# Patient Record
Sex: Male | Born: 1935 | Race: White | Hispanic: No | Smoking: Never smoker
Health system: Southern US, Community
[De-identification: ages and names within clinical notes are randomized; demographics above are authoritative.]

## PROBLEM LIST (undated history)

## (undated) DIAGNOSIS — I509 Heart failure, unspecified: Secondary | ICD-10-CM

## (undated) DIAGNOSIS — C4490 Unspecified malignant neoplasm of skin, unspecified: Secondary | ICD-10-CM

## (undated) DIAGNOSIS — H9190 Unspecified hearing loss, unspecified ear: Secondary | ICD-10-CM

## (undated) DIAGNOSIS — I10 Essential (primary) hypertension: Secondary | ICD-10-CM

## (undated) DIAGNOSIS — C801 Malignant (primary) neoplasm, unspecified: Secondary | ICD-10-CM

## (undated) DIAGNOSIS — I4891 Unspecified atrial fibrillation: Secondary | ICD-10-CM

## (undated) HISTORY — PX: SPINE SURGERY: SHX786

---

## 1998-12-13 ENCOUNTER — Ambulatory Visit (HOSPITAL_COMMUNITY): Admission: RE | Admit: 1998-12-13 | Discharge: 1998-12-13 | Payer: Self-pay | Admitting: Neurosurgery

## 1998-12-13 ENCOUNTER — Encounter: Payer: Self-pay | Admitting: Neurosurgery

## 1999-01-09 ENCOUNTER — Encounter: Payer: Self-pay | Admitting: Neurosurgery

## 1999-01-11 ENCOUNTER — Encounter: Payer: Self-pay | Admitting: Neurosurgery

## 1999-01-11 ENCOUNTER — Inpatient Hospital Stay (HOSPITAL_COMMUNITY): Admission: RE | Admit: 1999-01-11 | Discharge: 1999-01-17 | Payer: Self-pay | Admitting: Neurosurgery

## 2005-02-26 ENCOUNTER — Ambulatory Visit (HOSPITAL_COMMUNITY): Admission: RE | Admit: 2005-02-26 | Discharge: 2005-02-26 | Payer: Self-pay | Admitting: Urology

## 2005-02-26 ENCOUNTER — Ambulatory Visit (HOSPITAL_BASED_OUTPATIENT_CLINIC_OR_DEPARTMENT_OTHER): Admission: RE | Admit: 2005-02-26 | Discharge: 2005-02-26 | Payer: Self-pay | Admitting: Urology

## 2005-02-26 ENCOUNTER — Encounter (INDEPENDENT_AMBULATORY_CARE_PROVIDER_SITE_OTHER): Payer: Self-pay | Admitting: Specialist

## 2006-08-08 ENCOUNTER — Ambulatory Visit: Payer: Self-pay | Admitting: Internal Medicine

## 2006-08-08 ENCOUNTER — Encounter (INDEPENDENT_AMBULATORY_CARE_PROVIDER_SITE_OTHER): Payer: Self-pay | Admitting: Orthopaedic Surgery

## 2006-08-08 ENCOUNTER — Ambulatory Visit: Admission: RE | Admit: 2006-08-08 | Discharge: 2006-08-08 | Payer: Self-pay | Admitting: Orthopaedic Surgery

## 2006-08-11 ENCOUNTER — Inpatient Hospital Stay (HOSPITAL_COMMUNITY): Admission: RE | Admit: 2006-08-11 | Discharge: 2006-08-12 | Payer: Self-pay | Admitting: Orthopaedic Surgery

## 2006-09-27 ENCOUNTER — Inpatient Hospital Stay (HOSPITAL_COMMUNITY): Admission: EM | Admit: 2006-09-27 | Discharge: 2006-10-04 | Payer: Self-pay | Admitting: Emergency Medicine

## 2006-10-01 ENCOUNTER — Encounter (INDEPENDENT_AMBULATORY_CARE_PROVIDER_SITE_OTHER): Payer: Self-pay | Admitting: Otolaryngology

## 2006-11-05 ENCOUNTER — Encounter: Admission: RE | Admit: 2006-11-05 | Discharge: 2006-11-05 | Payer: Self-pay | Admitting: Orthopaedic Surgery

## 2006-11-12 ENCOUNTER — Encounter: Admission: RE | Admit: 2006-11-12 | Discharge: 2006-11-12 | Payer: Self-pay | Admitting: Orthopaedic Surgery

## 2009-05-16 ENCOUNTER — Inpatient Hospital Stay (HOSPITAL_COMMUNITY): Admission: EM | Admit: 2009-05-16 | Discharge: 2009-05-19 | Payer: Self-pay | Admitting: Emergency Medicine

## 2010-06-03 LAB — BASIC METABOLIC PANEL
BUN: 20 mg/dL (ref 6–23)
CO2: 27 mEq/L (ref 19–32)
Chloride: 103 mEq/L (ref 96–112)
Glucose, Bld: 86 mg/dL (ref 70–99)
Potassium: 4.1 mEq/L (ref 3.5–5.1)

## 2010-06-03 LAB — CBC
HCT: 44.5 % (ref 39.0–52.0)
Hemoglobin: 13.8 g/dL (ref 13.0–17.0)
MCHC: 32.4 g/dL (ref 30.0–36.0)
MCHC: 33 g/dL (ref 30.0–36.0)
MCV: 100 fL (ref 78.0–100.0)
MCV: 100.7 fL — ABNORMAL HIGH (ref 78.0–100.0)
Platelets: 164 10*3/uL (ref 150–400)
RBC: 4.18 MIL/uL — ABNORMAL LOW (ref 4.22–5.81)
RDW: 14.5 % (ref 11.5–15.5)
WBC: 4.7 10*3/uL (ref 4.0–10.5)

## 2010-06-03 LAB — PROTIME-INR: Prothrombin Time: 29.4 seconds — ABNORMAL HIGH (ref 11.6–15.2)

## 2010-06-03 LAB — DIFFERENTIAL
Basophils Absolute: 0 10*3/uL (ref 0.0–0.1)
Basophils Relative: 1 % (ref 0–1)
Eosinophils Absolute: 0.1 10*3/uL (ref 0.0–0.7)
Eosinophils Absolute: 0.1 10*3/uL (ref 0.0–0.7)
Eosinophils Relative: 2 % (ref 0–5)
Lymphs Abs: 0.9 10*3/uL (ref 0.7–4.0)
Monocytes Absolute: 0.5 10*3/uL (ref 0.1–1.0)
Monocytes Relative: 10 % (ref 3–12)
Neutro Abs: 3.2 10*3/uL (ref 1.7–7.7)

## 2010-07-24 NOTE — Op Note (Signed)
NAME:  LEWIN, PELLOW NO.:  [id number]   MEDICAL RECORD NO.:  [id number]          PATIENT TYPE:  INP   LOCATION:  4708                         FACILITY:  [facility]   PHYSICIAN:  Lucky Cowboy, MD         DATE OF BIRTH:  December 05, 1913   DATE OF PROCEDURE:  10/01/2006  DATE OF DISCHARGE:                               OPERATIVE REPORT   PREOPERATIVE DIAGNOSIS:  Right-sided tonsil mass.   POSTOPERATIVE DIAGNOSIS:  Right-sided tonsil mass with right  intratonsillar abscess.   PROCEDURE:  Right tonsillectomy.   SURGEON:  Lucky Cowboy, MD   ANESTHESIA:  General endotracheal anesthesia.   ESTIMATED BLOOD LOSS:  Less than 10 mL.   SPECIMENS:  Right tonsil was culture of right tonsil pus.   COMPLICATIONS:  None.   INDICATIONS:  This patient is a 77 year old male with an acute onset of  severe right throat pain and swelling, which occurred 5 days ago.  He  has been the hospital on IV Unasyn with some improvement.  However, re-  CT scan did reveal a 1 cm right tonsil mass.  The concern would be that  he has underlying neoplasia with secondary infection and for this  reason, excisional biopsy is performed.   FINDINGS:  The patient was noted to have a significant amount of  firmness within the right tonsil, particularly superiorly.  There is  also intratonsillar pus.   PROCEDURE:  The patient was taken to the operating room and placed on  the table in the supine position.  He was then placed under general  endotracheal anesthesia and the table rotated counterclockwise 90  degrees.  The neck was gently extended.  The head and body were draped  in the usual sterile fashion.  A Crowe-Davis mouth gag with a #4 tongue  blade was then placed intraorally, opened and suspended on the Mayo  stand.  The right palatine tonsil was then palpated.  It was determined  that this abnormal portion should be excised.  This almost involved the  entire tonsil and for this reason, tonsillectomy  was performed.  The  right palatine tonsil was grasped with Allis clamps and directed  inferior medially.  The Bovie was then used to excise the tonsil.  Pus  was expressed in this process and sent for culture and sensitivity, both  aerobic and anaerobic.  Suction cautery was required for hemostasis.  The oral cavity was then suctioned free of blood.  An NG tube was placed  down the esophagus for suctioning of the gastric  contents.  The mouth gag was removed, noting no damage to the teeth or  soft tissues.  The table was rotated clockwise 90 degrees to its  original position.  The patient was awakened from anesthesia and taken  to the Post Anesthesia Care Unit in stable condition.  There were no  complications.      Lucky Cowboy, MD  Electronically Signed     SJ/MEDQ  D:  10/01/2006  T:  10/02/2006  Job:  [id number]   cc:   [facility]

## 2010-07-24 NOTE — Op Note (Signed)
NAME:  Dustin Bean, Dustin Bean NO.:  [id number]   MEDICAL RECORD NO.:  [id number]          PATIENT TYPE:  INP   LOCATION:  5009                         FACILITY:  [facility]   PHYSICIAN:  Mark C. Ophelia Charter, M.D.    DATE OF BIRTH:  [birth year]   DATE OF PROCEDURE:  08/11/2006  DATE OF DISCHARGE:  08/12/2006                               OPERATIVE REPORT   REDICTATION:   PREOPERATIVE DIAGNOSIS:  L2-3 spinal stenosis.   POSTOPERATIVE DIAGNOSIS:  L2-3 spinal stenosis.   PROCEDURE:  L2-3 central decompression.   SURGEON:  Annell Greening, MD   ASSISTANT:  Maud Deed, PA-C   ANESTHESIA:  GOT plus Marcaine locally.   ESTIMATED BLOOD LOSS:  150 mL.   DRAINS:  None.   INDICATION:  This 77 year old male has severe spinal stenosis with  complete cut-off on myelo-CT with neurogenic claudication.  For his age,  he is in exceptionally good condition and other than neurogenic  claudication, he is not having any significant problems.  He is admitted  for a decompression.   PROCEDURE:  After induction of general anesthesia, the patient was  placed prone with careful padding, positioning and a time-out procedure.  Preoperative antibiotics were given prophylactically.  DuraPrep, the  area squared with towels, Betadine and Vi-Drape.  Laminectomy sheets and  drapes.  A spinal needle localization was performed with cross-table  lateral x-ray and incision was made.  Self-retaining retractors were  placed.  Subperiosteal dissection at the 2-3 level was performed and  decompression was performed, removing the L2 lamina and the top portion  of the L3 lamina, exposing the L2-3 disk.  There was hypertrophic  ligamentum with thick chunks and dura was decompressed without dural  tear.  Bone was removed out to the level of the pedicles bilaterally and  epidural fat was seen above and below the areas of severe compression.  After irrigation with saline solution, the operative microscope, which  was  used during the procedure, was removed; it has been draped at the  beginning of the procedure.  Nerve roots were free in both foramina by  palpation with a hockey-stick, no fragments anterior to the  dura by palpation and visualization.  The deep fascia was closed with 0  Vicryl, 2-0 Vicryl in the subcutaneous tissue and then skin closure.  Instrument count and needle count was correct.  The patient tolerated  the procedure well.  Office followup 1 week postop.      Mark C. Ophelia Charter, M.D.  Electronically Signed     MCY/MEDQ  D:  10/17/2006  T:  10/18/2006  Job:  [id number]

## 2010-07-24 NOTE — Discharge Summary (Signed)
NAME:  Dustin Bean, COONRADT NO.:  [id number]   MEDICAL RECORD NO.:  [id number]          PATIENT TYPE:  INP   LOCATION:  4708                         FACILITY:  [facility]   PHYSICIAN:  Lucky Cowboy, MD         DATE OF BIRTH:  1914-02-24   DATE OF ADMISSION:  09/27/2006  DATE OF DISCHARGE:  10/04/2006                               DISCHARGE SUMMARY   DISCHARGE DIAGNOSES:  1. Right-sided peritonsillar abscess.  2. Atrial fibrillation.  3. History of prostate cancer.  4. Spinal stenosis.   PAST SURGICAL HISTORY:  Four spinal stenosis surgeries were performed by  Dr. Ophelia Charter.   PROCEDURES PERFORMED:  Right-sided tonsillectomy.   CONSULTATIONS:  Internal medicine for anticoagulation assistance.   HOSPITAL COURSE:  This patient is a 77 year old male with a 2-day  history of right-sided sore throat but no fever.  He was seen at [facility] and referred.  Examination in the emergency room  revealed the right tonsil to be with yellow and protuberant region,  approximately 6 mm area.  There was tonsillar enlargement with erythema.  However, the soft palate was normal.  He was admitted for evaluation  with the concern being possible tonsillar abscess versus tonsillar  cancer.  Strep was negative.  He was also admitted for pain control.  A  CT scan with contrast was performed.  This revealed an enlarged  enhancing mass in the right tonsil.  The following morning, after being  placed on Unasyn, he was remarkably improved with minimal pain and a  marked decrease in the right tonsil area.  The concern still remained  with the CT scan findings despite his improvement.  [facility] team was  consulted for converting the patient to heparin for a planned biopsy  versus tonsillectomy.  On September 30, 2006, a CT scan was repeated which  revealed a 1-cm mass in the right tonsil.  He was taken to the operating  room.  A right tonsillectomy was performed.  This revealed a tonsillar  abscess with purulent fluid in the right upper pole.  He tolerated this  procedure very well and was restarted on anticoagulation before  discharge with Coumadin.  He did not have any problems whatsoever and  was discharged on October 04, 2006, with plans to follow up in his Coumadin  clinic.   DISCHARGE CONDITION:  Stable.      Lucky Cowboy, MD  Electronically Signed     SJ/MEDQ  D:  11/06/2006  T:  11/07/2006  Job:  [id number]

## 2010-07-27 NOTE — Op Note (Signed)
NAME:  Dustin Bean, Dustin Bean NO.:  [id number]   MEDICAL RECORD NO.:  [id number]          PATIENT TYPE:  AMB   LOCATION:  NESC                         FACILITY:  [facility]   PHYSICIAN:  Ronald L. Earlene Plater, M.D.  DATE OF BIRTH:  Oct 05, 1913   DATE OF PROCEDURE:  02/26/2005  DATE OF DISCHARGE:                                 OPERATIVE REPORT   DIAGNOSIS:  Balanitis with ulceration of the foreskin.   OPERATIVE PROCEDURE:  Circumcision.   SURGEON:  Lucrezia Starch. Earlene Plater, M.D.   ANESTHESIA:  LMA.   ESTIMATED BLOOD LOSS:  Negligible.   TUBES:  None.   COMPLICATIONS:  None.   INDICATIONS FOR PROCEDURE:  [name] is a very nice 77 year old white male  who presented with an ulcerating foreskin noted to have progressively  worsened.  It certainly needs to be biopsied, but with the worsening  situation, irritation, and pain, it was felt that circumcision was  indicated.  He has been properly cleared.  He stopped his Coumadin ahead and  has elected to proceed.   PROCEDURE IN DETAIL:  Patient was placed in a supine position.  After proper  LMA anesthesia, he was placed in the dorsal lithotomy position, prepped and  draped with Betadine in a sterile fashion.  A circumferential incision was  made in the shaft skin at the appropriate level and extended to Buck's  fascia, corpus spongiosum, and a similar incision was made approximately 2  mm proximal to the corona __________ and extended to the same level.  A  dorsal slit was then performed, and the foreskin was removed with Bovie  coagulation, cautery, and sharp dissection.  It was submitted to pathology  for definitive diagnosis.  Thorough irrigation was performed.  Good  hemostasis was noted to be present.  The shaft skin was approximated to the  mucosa.  A dorsal stitch was placed, and a ventral U-type stitch was placed,  and each were run laterally.  A frenuloplasty was then performed with a  horizontal mattress type stitch in the  frenulum.  Good hemostasis was noted  to be present.  The wound was dressed with Vaseline gauze, 4x4, and a Coban.  He tolerated the procedure well.  No complications.  He was taken to the  recovery room stable.      Ronald L. Earlene Plater, M.D.  Electronically Signed     RLD/MEDQ  D:  02/26/2005  T:  02/27/2005  Job:  [id number]

## 2010-12-24 LAB — CBC
HCT: 35.7 — ABNORMAL LOW
HCT: 36.2 — ABNORMAL LOW
HCT: 36.3 — ABNORMAL LOW
HCT: 36.8 — ABNORMAL LOW
Hemoglobin: 12.2 — ABNORMAL LOW
Hemoglobin: 12.3 — ABNORMAL LOW
Hemoglobin: 12.4 — ABNORMAL LOW
Hemoglobin: 12.7 — ABNORMAL LOW
MCHC: 34.2
MCHC: 34.3
MCHC: 34.5
MCV: 95.1
MCV: 95.5
MCV: 96.3
MCV: 97.2
Platelets: 174
Platelets: 180
Platelets: 183
RBC: 3.71 — ABNORMAL LOW
RBC: 3.8 — ABNORMAL LOW
RBC: 3.87 — ABNORMAL LOW
RDW: 13.9
RDW: 14
RDW: 14.1 — ABNORMAL HIGH
RDW: 14.3 — ABNORMAL HIGH
WBC: 4.6
WBC: 6.6

## 2010-12-24 LAB — DIFFERENTIAL
Basophils Absolute: 0
Basophils Relative: 0
Eosinophils Absolute: 0.1
Eosinophils Relative: 1
Monocytes Absolute: 0.6

## 2010-12-24 LAB — RAPID STREP SCREEN (MED CTR MEBANE ONLY): Streptococcus, Group A Screen (Direct): NEGATIVE

## 2010-12-24 LAB — BASIC METABOLIC PANEL
BUN: 8
CO2: 30
Calcium: 8.8
Chloride: 104
Creatinine, Ser: 1.12
GFR calc Af Amer: >60
GFR calc non Af Amer: >60
Glucose, Bld: 97
Potassium: 3.9
Sodium: 139

## 2010-12-24 LAB — COMPREHENSIVE METABOLIC PANEL WITH GFR
ALT: 14
AST: 23
Albumin: 3.5
Alkaline Phosphatase: 62
BUN: 10
CO2: 29
Calcium: 8.8
Chloride: 106
Creatinine, Ser: 0.93
GFR calc non Af Amer: >60
Glucose, Bld: 95
Potassium: 4.2
Sodium: 138
Total Bilirubin: 0.7
Total Protein: 6.5

## 2010-12-24 LAB — PROTIME-INR
INR: 1.2
INR: 1.5
INR: 2.4 — ABNORMAL HIGH
INR: 3 — ABNORMAL HIGH
Prothrombin Time: 15.2
Prothrombin Time: 18.6 — ABNORMAL HIGH
Prothrombin Time: 27.3 — ABNORMAL HIGH
Prothrombin Time: 30 — ABNORMAL HIGH
Prothrombin Time: 32.6 — ABNORMAL HIGH

## 2010-12-24 LAB — ABO/RH: ABO/RH(D): O NEG

## 2010-12-24 LAB — CULTURE, ROUTINE-ABSCESS: Culture: NORMAL

## 2010-12-24 LAB — HEPARIN LEVEL (UNFRACTIONATED)
Heparin Unfractionated: 0.21 — ABNORMAL LOW
Heparin Unfractionated: <0.1 — ABNORMAL LOW

## 2010-12-24 LAB — ANAEROBIC CULTURE

## 2010-12-24 LAB — PREPARE FRESH FROZEN PLASMA

## 2010-12-27 LAB — APTT: aPTT: 33

## 2010-12-27 LAB — PROTIME-INR: Prothrombin Time: 15.4 — ABNORMAL HIGH

## 2014-01-28 ENCOUNTER — Encounter (HOSPITAL_COMMUNITY): Payer: Self-pay

## 2014-01-28 ENCOUNTER — Emergency Department (HOSPITAL_COMMUNITY)
Admission: EM | Admit: 2014-01-28 | Discharge: 2014-01-28 | Disposition: A | Discharge status: Home / Self Care | Payer: Medicare Other | Attending: Emergency Medicine | Admitting: Emergency Medicine

## 2014-01-28 DIAGNOSIS — Z7901 Long term (current) use of anticoagulants: Secondary | ICD-10-CM | POA: Insufficient documentation

## 2014-01-28 DIAGNOSIS — Z7982 Long term (current) use of aspirin: Secondary | ICD-10-CM | POA: Diagnosis not present

## 2014-01-28 DIAGNOSIS — Z79899 Other long term (current) drug therapy: Secondary | ICD-10-CM | POA: Diagnosis not present

## 2014-01-28 DIAGNOSIS — I4891 Unspecified atrial fibrillation: Secondary | ICD-10-CM | POA: Insufficient documentation

## 2014-01-28 DIAGNOSIS — Z8546 Personal history of malignant neoplasm of prostate: Secondary | ICD-10-CM | POA: Diagnosis not present

## 2014-01-28 DIAGNOSIS — K625 Hemorrhage of anus and rectum: Secondary | ICD-10-CM | POA: Diagnosis not present

## 2014-01-28 DIAGNOSIS — N400 Enlarged prostate without lower urinary tract symptoms: Secondary | ICD-10-CM | POA: Insufficient documentation

## 2014-01-28 DIAGNOSIS — Z7951 Long term (current) use of inhaled steroids: Secondary | ICD-10-CM | POA: Diagnosis not present

## 2014-01-28 HISTORY — DX: Malignant (primary) neoplasm, unspecified: C80.1

## 2014-01-28 HISTORY — DX: Unspecified atrial fibrillation: I48.91

## 2014-01-28 LAB — COMPREHENSIVE METABOLIC PANEL
ALK PHOS: 54 U/L (ref 39–117)
ALT: 16 U/L (ref 0–53)
ANION GAP: 10 (ref 5–15)
AST: 20 U/L (ref 0–37)
Albumin: 3.5 g/dL (ref 3.5–5.2)
BILIRUBIN TOTAL: 0.9 mg/dL (ref 0.3–1.2)
BUN: 18 mg/dL (ref 6–23)
CHLORIDE: 104 meq/L (ref 96–112)
CO2: 27 meq/L (ref 19–32)
Calcium: 9.3 mg/dL (ref 8.4–10.5)
Creatinine, Ser: 1.12 mg/dL (ref 0.50–1.35)
GFR, EST AFRICAN AMERICAN: 61 mL/min — AB (ref >90)
GFR, EST NON AFRICAN AMERICAN: 53 mL/min — AB (ref >90)
GLUCOSE: 95 mg/dL (ref 70–99)
POTASSIUM: 4.7 meq/L (ref 3.7–5.3)
Sodium: 141 mEq/L (ref 137–147)
TOTAL PROTEIN: 6.6 g/dL (ref 6.0–8.3)

## 2014-01-28 LAB — POC OCCULT BLOOD, ED: Fecal Occult Bld: NEGATIVE

## 2014-01-28 LAB — CBC WITH DIFFERENTIAL/PLATELET
Basophils Absolute: 0 10*3/uL (ref 0.0–0.1)
Basophils Relative: 0 % (ref 0–1)
EOS ABS: 0 10*3/uL (ref 0.0–0.7)
Eosinophils Relative: 1 % (ref 0–5)
HCT: 42.5 % (ref 39.0–52.0)
HEMOGLOBIN: 14.3 g/dL (ref 13.0–17.0)
LYMPHS ABS: 1 10*3/uL (ref 0.7–4.0)
LYMPHS PCT: 16 % (ref 12–46)
MCH: 34.5 pg — AB (ref 26.0–34.0)
MCHC: 33.6 g/dL (ref 30.0–36.0)
MCV: 102.4 fL — ABNORMAL HIGH (ref 78.0–100.0)
MONOS PCT: 6 % (ref 3–12)
Monocytes Absolute: 0.4 10*3/uL (ref 0.1–1.0)
NEUTROS PCT: 77 % (ref 43–77)
Neutro Abs: 4.7 10*3/uL (ref 1.7–7.7)
Platelets: 122 10*3/uL — ABNORMAL LOW (ref 150–400)
RBC: 4.15 MIL/uL — AB (ref 4.22–5.81)
RDW: 15 % (ref 11.5–15.5)
WBC: 6.1 10*3/uL (ref 4.0–10.5)

## 2014-01-28 LAB — PROTIME-INR
INR: 1.93 — ABNORMAL HIGH (ref 0.00–1.49)
Prothrombin Time: 22.2 seconds — ABNORMAL HIGH (ref 11.6–15.2)

## 2014-01-28 MED ORDER — LOPERAMIDE HCL 2 MG PO CAPS: 2.0000 mg | ORAL_CAPSULE | Freq: Four times a day (QID) | ORAL | Status: DC | PRN

## 2014-01-28 NOTE — ED Notes (Signed)
Pt states 3 large BMs early this am, very dark in color, loose. No abd pain, N/V. States did increase his coumadin as directed by doctor over last couple of days. PA in room with pt, assisted in obtaining fecal occult. Pt states no needs at this time, vitals WNL, call bell in reach

## 2014-01-28 NOTE — ED Provider Notes (Signed)
CSN: [id number]     Arrival date & time 01/28/14  1301 History   First MD Initiated Contact with Patient 01/28/14 1316     Chief Complaint  Patient presents with  . Rectal Bleeding     (Consider location/radiation/quality/duration/timing/severity/associated sxs/prior Treatment) HPI Comments: Patient with history of atrial fibrillation on Coumadin, a baby aspirin a day, diclofenac gel -- presents with complaint of dark black stool. Patient states that he woke at approximately 5:30 AM today and had 3 bowel movements containing small round black stools. No bright red blood. Patient was not in any pain. Patient recently had his Coumadin dose adjusted upwards because his INR was low. Family is concerned that he is having a GI bleed. She does not have any abdominal pain. No lightheadedness or syncope. Patient thinks that his last colonoscopy was 3 years ago. He does not know that he has ever had diverticulosis, diverticulitis, AVMs. He does not report a history of hemorrhoids. No history of ulcers or upper abdominal pain. He currently has no other complaints. Patient was actually seen at an outside urgent care prior to ED arrival.  The history is provided by the patient, medical records and a relative.    Past Medical History  Diagnosis Date  . Atrial fibrillation   . Cancer     prostate   Past Surgical History  Procedure Laterality Date  . Spine surgery      x 4   History reviewed. No pertinent family history. History  Substance Use Topics  . Smoking status: Never Smoker   . Smokeless tobacco: Not on file  . Alcohol Use: Yes     Comment: rarely    Review of Systems  Constitutional: Negative for fever.  HENT: Negative for rhinorrhea and sore throat.   Eyes: Negative for redness.  Respiratory: Negative for cough.   Cardiovascular: Negative for chest pain.  Gastrointestinal: Positive for blood in stool (black stools seen). Negative for nausea, vomiting, abdominal pain and diarrhea.   Genitourinary: Negative for dysuria.  Musculoskeletal: Negative for myalgias.  Skin: Negative for rash.  Neurological: Negative for light-headedness and headaches.    Allergies  Review of patient's allergies indicates no known allergies.  Home Medications   Prior to Admission medications   Medication Sig Start Date End Date Taking? Authorizing Provider  Ascorbic Acid (VITAMIN C) 1000 MG tablet Take 1,000 mg by mouth daily.   Yes Historical Provider, MD  aspirin 81 MG EC tablet Take 81 mg by mouth daily. Swallow whole.   Yes Historical Provider, MD  atenolol (TENORMIN) 50 MG tablet Take 50 mg by mouth daily.   Yes Historical Provider, MD  Calcium-Magnesium-Vitamin D 400-166.7-133.3 MG-MG-UNIT TABS Take 1 tablet by mouth daily.   Yes Historical Provider, MD  cholecalciferol (VITAMIN D) 1000 UNITS tablet Take 2,000 Units by mouth daily.   Yes Historical Provider, MD  diclofenac sodium (VOLTAREN) 1 % GEL Apply 2 g topically 4 (four) times daily as needed (for pain).   Yes Historical Provider, MD  donepezil (ARICEPT) 10 MG tablet Take 5 mg by mouth at bedtime.   Yes Historical Provider, MD  fexofenadine (ALLEGRA) 180 MG tablet Take 180 mg by mouth daily.   Yes Historical Provider, MD  fluticasone (FLONASE) 50 MCG/ACT nasal spray Place 2 sprays into both nostrils daily.   Yes Historical Provider, MD  furosemide (LASIX) 20 MG tablet Take 20 mg by mouth daily.   Yes Historical Provider, MD  gabapentin (NEURONTIN) 100 MG capsule Take 100 mg  by mouth at bedtime as needed (for pain).   Yes Historical Provider, MD  Glucosamine-Chondroitin-MSM [phone] MG TABS Take 1 tablet by mouth daily.   Yes Historical Provider, MD  Influenza vac split quadrivalent PF (FLUARIX) 0.5 ML injection Inject 0.5 mLs into the muscle once.   Yes Historical Provider, MD  lovastatin (MEVACOR) 40 MG tablet Take 40 mg by mouth at bedtime.   Yes Historical Provider, MD  methocarbamol (ROBAXIN) 500 MG tablet Take 500 mg by  mouth 2 (two) times daily.   Yes Historical Provider, MD  mupirocin cream (BACTROBAN) 2 % Apply 1 application topically 2 (two) times daily.   Yes Historical Provider, MD  Omega-3 Fatty Acids (FISH OIL) 1000 MG CAPS Take 1 capsule by mouth daily.   Yes Historical Provider, MD  omeprazole (PRILOSEC) 20 MG capsule Take 20 mg by mouth daily.   Yes Historical Provider, MD  potassium chloride SA (K-DUR,KLOR-CON) 20 MEQ tablet Take 20 mEq by mouth daily.   Yes Historical Provider, MD  tamsulosin (FLOMAX) 0.4 MG CAPS capsule Take 0.4 mg by mouth daily.   Yes Historical Provider, MD  warfarin (COUMADIN) 1 MG tablet Take 6-6.5 mg by mouth daily. Takes with 5mg  to get a total of 6mg  or 6.5mg   Takes 6mg  every other day, then takes 6.5mg  every other day   Yes Historical Provider, MD  warfarin (COUMADIN) 5 MG tablet Take 6-6.5 mg by mouth daily. Takes with 5mg  to get a total of 6mg  or 6.5mg   Takes 6mg  every other day, then takes 6.5mg  every other day   Yes Historical Provider, MD   BP 138/89 mmHg  Pulse 96  Temp(Src) 97.6 F (36.4 C) (Oral)  Resp 20  SpO2 96%   Physical Exam  Constitutional: He appears well-developed and well-nourished.  HENT:  Head: Normocephalic and atraumatic.  Eyes: Conjunctivae are normal. Right eye exhibits no discharge. Left eye exhibits no discharge.  Neck: Normal range of motion. Neck supple.  Cardiovascular: Normal rate, regular rhythm and normal heart sounds.   Pulmonary/Chest: Effort normal and breath sounds normal. No respiratory distress. He has no wheezes. He has no rales.  Abdominal: Soft. Bowel sounds are normal. He exhibits no distension. There is no tenderness. There is no rebound and no guarding.  Genitourinary: Rectal exam shows no external hemorrhoid, no internal hemorrhoid, no fissure, no mass, no tenderness and anal tone normal. Guaiac negative stool. Prostate is enlarged. Prostate is not tender.  Neurological: He is alert.  Skin: Skin is warm and dry.   Psychiatric: He has a normal mood and affect.  Nursing note and vitals reviewed.   ED Course  Procedures (including critical care time) Labs Review Labs Reviewed  CBC WITH DIFFERENTIAL - Abnormal; Notable for the following:    RBC 4.15 (*)    MCV 102.4 (*)    MCH 34.5 (*)    Platelets 122 (*)    All other components within normal limits  COMPREHENSIVE METABOLIC PANEL - Abnormal; Notable for the following:    GFR calc non Af Amer 53 (*)    GFR calc Af Amer 61 (*)    All other components within normal limits  PROTIME-INR - Abnormal; Notable for the following:    Prothrombin Time 22.2 (*)    INR 1.93 (*)    All other components within normal limits  OCCULT BLOOD X 1 CARD TO LAB, STOOL  POC OCCULT BLOOD, ED    Imaging Review No results found.   EKG Interpretation None  1:47 PM Patient seen and examined. Work-up initiated. Discussed with Dr. Kathrynn Humble. Rectal exam performed with nurse chaperone.   Vital signs reviewed and are as follows: BP 138/89 mmHg  Pulse 96  Temp(Src) 97.6 F (36.4 C) (Oral)  Resp 20  SpO2 96%  Patient seen by Dr. Kathrynn Humble. Heme neg stool here -- no active bleeding. INR slightly low. Hgb normal. No abd pain. Vital signs are normal.   We both feel patient is safe for d/c to home at this point with close PCP and coumadin clinic follow-up.   Return instructions given to return if bleeding recurrs, he develops abdominal pain, or has other concerns.   BP 147/81 mmHg  Pulse 55  Temp(Src) 97.1 F (36.2 C) (Axillary)  Resp 19  SpO2 100%   MDM   Final diagnoses:  Rectal bleeding   Questionable rectal bleeding as above. Heme neg stools now without abd pain. INR subtherapeutic. Hgb normal. VSS. No indications for admission at this time. Patient is 80yo but looks extraordinarily well and has responsible family members to monitor him closely. He has appropriate follow-up. D/c to home with strict return instructions.   No dangerous or  life-threatening conditions suspected or identified by history, physical exam, and by work-up. No indications for hospitalization identified.     Carlisle Cater, PA-C 01/28/14 [place], MD 01/28/14 [phone]

## 2014-01-28 NOTE — ED Notes (Signed)
Per pt, having rectal bleeding.  Started today. Had coumadin increased 2 days ago d/t INR.

## 2014-01-28 NOTE — Discharge Instructions (Signed)
Please read and follow all provided instructions.  Your diagnoses today include:  1. Rectal bleeding    Tests performed today include:  Vital signs. See below for your results today.   INR - 1.9  Blood counts - normal  Stool test for blood - negative here  Medications prescribed:   None  Take any prescribed medications only as directed.  Home care instructions:  Follow any educational materials contained in this packet.  Follow-up instructions: Please follow-up with your primary care provider in the next 3 days for further evaluation of your symptoms.   Return instructions:   Please return to the Emergency Department if you experience worsening symptoms.   Please return if you have any other emergent concerns.  Additional Information:  Your vital signs today were: BP 138/89 mmHg   Pulse 96   Temp(Src) 97.6 F (36.4 C) (Oral)   Resp 20   SpO2 96% If your blood pressure (BP) was elevated above 135/85 this visit, please have this repeated by your doctor within one month. --------------

## 2014-02-01 ENCOUNTER — Ambulatory Visit
Admission: RE | Admit: 2014-02-01 | Discharge: 2014-02-01 | Disposition: A | Discharge status: Home / Self Care | Payer: Medicare Other | Source: Ambulatory Visit | Attending: Cardiology | Admitting: Cardiology

## 2014-02-01 ENCOUNTER — Ambulatory Visit (INDEPENDENT_AMBULATORY_CARE_PROVIDER_SITE_OTHER): Payer: Medicare Other | Admitting: Cardiology

## 2014-02-01 ENCOUNTER — Encounter: Payer: Self-pay | Admitting: Cardiology

## 2014-02-01 VITALS — BP 110/70 | HR 85 | Ht 73.0 in | Wt 186.0 lb

## 2014-02-01 DIAGNOSIS — R609 Edema, unspecified: Secondary | ICD-10-CM

## 2014-02-01 DIAGNOSIS — I4510 Unspecified right bundle-branch block: Secondary | ICD-10-CM

## 2014-02-01 DIAGNOSIS — I482 Chronic atrial fibrillation, unspecified: Secondary | ICD-10-CM

## 2014-02-01 DIAGNOSIS — I4891 Unspecified atrial fibrillation: Secondary | ICD-10-CM | POA: Insufficient documentation

## 2014-02-01 NOTE — Patient Instructions (Addendum)
A chest x-ray takes a picture of the organs and structures inside the chest, including the heart, lungs, and blood vessels. This test can show several things, including, whether the heart is enlarges; whether fluid is building up in the lungs; and whether pacemaker / defibrillator leads are still in place. [place]  Your physician has requested that you have an echocardiogram. Echocardiography is a painless test that uses sound waves to create images of your heart. It provides your doctor with information about the size and shape of your heart and how well your heart's chambers and valves are working. This procedure takes approximately one hour. There are no restrictions for this procedure.  Your physician recommends that you schedule a follow-up appointment in: [place] (TO GET ESTABLISHED)  Your physician recommends that you continue on your current medications as directed. Please refer to the Current Medication list given to you today.

## 2014-02-01 NOTE — Progress Notes (Signed)
Dustin Bean Date of Birth:  [birth year] [address] [place], [place]  [address] [phone]        Fax   [phone]   History of Present Illness:  this very pleasant alert 80 year old gentleman is seen by me for the first time today. He comes to establish cardiology care here in [place]. He is the father of Dustin Bean. The patient has been a resident at [facility] for the past 3 months. Prior to that he was living in [place]. His PCP Dr. [name] is in [place]. He intends to continue to use Dr. [name] for his PCP. He previously had been going to [facility] to see Dr. [name] in cardiology. Dr. [name] has been treating him for his atrial fibrillation for about 10 years. He is on rate control and anticoagulation.  He has been on long-term Coumadin.  He has tolerated it well. He has not had any history of TIA or stroke. He does not have any history of ischemic heart disease. He does have a history of hypercholesterolemia and is on lovastatin. He has a history of osteoarthritis and is on glucosamine.  He has short-term memory disorder and is on donepezil 10 mg tablet taking 5 mg at bedtime.  He ambulates with the help of a rolling walker.  He has a history of urinary incontinence and wears depends.  He has had peripheral edema and takes furosemide 20 mg daily.  He does not think he could tolerate any higher dose than that.  Current Outpatient Prescriptions  Medication Sig Dispense Refill  . Ascorbic Acid (VITAMIN C) 1000 MG tablet Take 1,000 mg by mouth daily.    Marland Kitchen atenolol (TENORMIN) 50 MG tablet Take 50 mg by mouth daily.    . Calcium-Magnesium-Vitamin D 400-166.7-133.3 MG-MG-UNIT TABS Take 1 tablet by mouth daily.    . cholecalciferol (VITAMIN D) 1000 UNITS tablet Take 2,000 Units by mouth daily.    . diclofenac sodium (VOLTAREN) 1 % GEL Apply 2 g topically 4 (four) times daily as needed (for pain).    Marland Kitchen donepezil (ARICEPT) 10 MG tablet Take 5 mg  by mouth at bedtime.    . fexofenadine (ALLEGRA) 180 MG tablet Take 180 mg by mouth daily.    . fluticasone (FLONASE) 50 MCG/ACT nasal spray Place 2 sprays into both nostrils daily.    . furosemide (LASIX) 20 MG tablet Take 20 mg by mouth daily.    Marland Kitchen gabapentin (NEURONTIN) 100 MG capsule Take 100 mg by mouth at bedtime as needed (for pain).    . Glucosamine-Chondroitin-MSM 750-400-375 MG TABS Take 1 tablet by mouth daily.    . Influenza vac split quadrivalent PF (FLUARIX) 0.5 ML injection Inject 0.5 mLs into the muscle once.    . loperamide (IMODIUM) 2 MG capsule Take 1 capsule (2 mg total) by mouth 4 (four) times daily as needed for diarrhea or loose stools. 12 capsule 0  . lovastatin (MEVACOR) 40 MG tablet Take 40 mg by mouth at bedtime.    . methocarbamol (ROBAXIN) 500 MG tablet Take 500 mg by mouth 2 (two) times daily.    . mupirocin cream (BACTROBAN) 2 % Apply 1 application topically 2 (two) times daily.    . Omega-3 Fatty Acids (FISH OIL) 1000 MG CAPS Take 1 capsule by mouth daily.    Marland Kitchen omeprazole (PRILOSEC) 20 MG capsule Take 20 mg by mouth daily.    . potassium chloride SA (K-DUR,KLOR-CON) 20  MEQ tablet Take 20 mEq by mouth daily.    . tamsulosin (FLOMAX) 0.4 MG CAPS capsule Take 0.4 mg by mouth daily.    Marland Kitchen warfarin (COUMADIN) 1 MG tablet Take 6-6.5 mg by mouth daily. Takes with 5mg  to get a total of 6mg  or 6.5mg   Takes 6mg  every other day, then takes 6.5mg  every other day     No current facility-administered medications for this visit.    No Known Allergies  Patient Active Problem List   Diagnosis Date Noted  . Atrial fibrillation 02/01/2014  . Right bundle branch block 02/01/2014  . Peripheral edema 02/01/2014    History  Smoking status  . Never Smoker   Smokeless tobacco  . Not on file    History  Alcohol Use  . Yes    Comment: rarely    No family history on file.  Review of Systems: Constitutional: no fever chills diaphoresis or fatigue or change in weight.    Head and neck: no hearing loss, no epistaxis, no photophobia or visual disturbance. Respiratory: No cough, shortness of breath or wheezing. Cardiovascular: No chest pain peripheral edema, palpitations. Gastrointestinal: No abdominal distention, no abdominal pain, no change in bowel habits hematochezia or melena. Genitourinary: No dysuria, no frequency, no urgency, no nocturia. Positive for urinary incontinence Musculoskeletal:No arthralgias, no back pain, no gait disturbance or myalgias. Neurological: No dizziness, no headaches, no numbness, no seizures, no syncope, no weakness, no tremors. Hematologic: No lymphadenopathy, no easy bruising. Psychiatric: No confusion, no hallucinations, no sleep disturbance.   Wt Readings from Last 3 Encounters:  02/01/14 186 lb (84.369 kg)    Physical Exam: Filed Vitals:   02/01/14 1124  BP: 110/70  Pulse: 85  The patient appears to be in no distress.  Head and neck exam reveals that the pupils are equal and reactive.  The extraocular movements are full.  There is no scleral icterus.  Mouth and pharynx are benign.  No lymphadenopathy.  No carotid bruits.  The jugular venous pressure is normal.  Thyroid is not enlarged or tender.  Chest is clear to percussion and auscultation.  No rales or rhonchi.  Expansion of the chest is symmetrical.  Heart reveals no abnormal lift or heave.  First and second heart sounds are normal. The second sound is widely split but moves physiologically. The pulse is irregularly irregular.  There is no murmur gallop rub or click.  The abdomen is soft and nontender.  Bowel sounds are normoactive.  There is no hepatosplenomegaly or mass.  There are no abdominal bruits.  Extremities reveal  2+ soft pretibial and pedal edema.  Pedal pulses are good.  There is no cyanosis or clubbing.  Neurologic exam is normal strength and no lateralizing weakness.  No sensory deficits.  Integument reveals no rash   EKG shows atrial  fibrillation with controlled ventricular response. There is a right bundle branch block pattern. No prior tracing for comparison.   Assessment / Plan: 1. Chronic atrial fibrillation treated with rate control and warfarin anticoagulation. 2.  Right bundle branch block 3.  Peripheral edema 4.  Urinary incontinence 5.  Short-term memory disorder   Disposition: We will arrange for a update of his chest x-ray and a 2-D echo. We will get him established in our Coumadin clinic.  Continue current medication. Recheck in one month for office visit. Continue low salt diet and continue low-dose furosemide for the present time.

## 2014-02-07 ENCOUNTER — Encounter: Payer: Self-pay | Admitting: *Deleted

## 2014-02-08 ENCOUNTER — Other Ambulatory Visit (HOSPITAL_COMMUNITY): Payer: Medicare PPO

## 2014-02-10 ENCOUNTER — Ambulatory Visit (INDEPENDENT_AMBULATORY_CARE_PROVIDER_SITE_OTHER): Payer: Medicare PPO | Admitting: Pharmacist

## 2014-02-10 ENCOUNTER — Telehealth: Payer: Self-pay | Admitting: Cardiology

## 2014-02-10 DIAGNOSIS — I482 Chronic atrial fibrillation, unspecified: Secondary | ICD-10-CM

## 2014-02-10 LAB — POCT INR: INR: 4.2

## 2014-02-10 NOTE — Telephone Encounter (Signed)
Spoke with Benjamine Mola and advised patient stated seeing Coumadin clinic here today

## 2014-02-10 NOTE — Telephone Encounter (Deleted)
-----   Message from Darlin Coco, MD sent at 02/01/2014  8:17 PM EST ----- Please report.  The chest xray is satisfactory.

## 2014-02-10 NOTE — Telephone Encounter (Signed)
New message      Pt lives at [facility] here in [place].  His PCP is Alean Rinne MD in [place].  Since pt lives in [place]---he want to start coming here to have his coumadin checked---his cardiologist is Dr Mare Ferrari.  Will this be ok?

## 2014-02-14 ENCOUNTER — Ambulatory Visit (HOSPITAL_COMMUNITY): Payer: Medicare Other | Attending: Cardiology | Admitting: Radiology

## 2014-02-14 DIAGNOSIS — I482 Chronic atrial fibrillation, unspecified: Secondary | ICD-10-CM

## 2014-02-14 NOTE — Progress Notes (Signed)
Echocardiogram performed.  

## 2014-02-17 ENCOUNTER — Ambulatory Visit (INDEPENDENT_AMBULATORY_CARE_PROVIDER_SITE_OTHER): Payer: Medicare Other

## 2014-02-17 DIAGNOSIS — I482 Chronic atrial fibrillation, unspecified: Secondary | ICD-10-CM

## 2014-02-17 DIAGNOSIS — I4891 Unspecified atrial fibrillation: Secondary | ICD-10-CM

## 2014-02-17 LAB — POCT INR: INR: 1.9

## 2014-02-24 ENCOUNTER — Ambulatory Visit (INDEPENDENT_AMBULATORY_CARE_PROVIDER_SITE_OTHER): Payer: Medicare Other | Admitting: *Deleted

## 2014-02-24 DIAGNOSIS — I482 Chronic atrial fibrillation, unspecified: Secondary | ICD-10-CM

## 2014-02-24 DIAGNOSIS — I4891 Unspecified atrial fibrillation: Secondary | ICD-10-CM

## 2014-02-24 LAB — POCT INR: INR: 2.6

## 2014-03-01 ENCOUNTER — Ambulatory Visit: Payer: Medicare PPO | Admitting: Cardiology

## 2014-03-07 ENCOUNTER — Ambulatory Visit (INDEPENDENT_AMBULATORY_CARE_PROVIDER_SITE_OTHER): Payer: Medicare Other | Admitting: Pharmacist

## 2014-03-07 DIAGNOSIS — I482 Chronic atrial fibrillation, unspecified: Secondary | ICD-10-CM

## 2014-03-07 DIAGNOSIS — I4891 Unspecified atrial fibrillation: Secondary | ICD-10-CM

## 2014-03-07 LAB — POCT INR: INR: 3.2

## 2014-03-14 ENCOUNTER — Ambulatory Visit (INDEPENDENT_AMBULATORY_CARE_PROVIDER_SITE_OTHER): Payer: Medicare Other | Admitting: Pharmacist

## 2014-03-14 DIAGNOSIS — I482 Chronic atrial fibrillation, unspecified: Secondary | ICD-10-CM

## 2014-03-14 LAB — POCT INR: INR: 3.9

## 2014-03-16 ENCOUNTER — Encounter: Payer: Self-pay | Admitting: Cardiology

## 2014-03-21 ENCOUNTER — Ambulatory Visit (INDEPENDENT_AMBULATORY_CARE_PROVIDER_SITE_OTHER): Payer: Medicare Other | Admitting: Pharmacist

## 2014-03-21 DIAGNOSIS — I482 Chronic atrial fibrillation, unspecified: Secondary | ICD-10-CM

## 2014-03-21 DIAGNOSIS — I4891 Unspecified atrial fibrillation: Secondary | ICD-10-CM

## 2014-03-21 LAB — POCT INR: INR: 1.9

## 2014-03-28 ENCOUNTER — Ambulatory Visit (INDEPENDENT_AMBULATORY_CARE_PROVIDER_SITE_OTHER): Payer: Medicare Other

## 2014-03-28 DIAGNOSIS — I482 Chronic atrial fibrillation, unspecified: Secondary | ICD-10-CM

## 2014-03-28 DIAGNOSIS — I4891 Unspecified atrial fibrillation: Secondary | ICD-10-CM

## 2014-03-28 LAB — POCT INR: INR: 3

## 2014-03-29 ENCOUNTER — Ambulatory Visit: Payer: Medicare Other | Admitting: Cardiology

## 2014-04-05 ENCOUNTER — Encounter: Payer: Self-pay | Admitting: Cardiology

## 2014-04-11 ENCOUNTER — Ambulatory Visit (INDEPENDENT_AMBULATORY_CARE_PROVIDER_SITE_OTHER): Payer: Medicare Other | Admitting: *Deleted

## 2014-04-11 DIAGNOSIS — I4891 Unspecified atrial fibrillation: Secondary | ICD-10-CM

## 2014-04-11 DIAGNOSIS — I482 Chronic atrial fibrillation, unspecified: Secondary | ICD-10-CM

## 2014-04-11 LAB — POCT INR: INR: 3.2

## 2014-04-14 ENCOUNTER — Emergency Department (HOSPITAL_COMMUNITY): Payer: Medicare Other

## 2014-04-14 ENCOUNTER — Emergency Department (HOSPITAL_COMMUNITY)
Admission: EM | Admit: 2014-04-14 | Discharge: 2014-04-14 | Disposition: A | Discharge status: Home / Self Care | Payer: Medicare Other | Attending: Emergency Medicine | Admitting: Emergency Medicine

## 2014-04-14 ENCOUNTER — Encounter (HOSPITAL_COMMUNITY): Payer: Self-pay | Admitting: Emergency Medicine

## 2014-04-14 DIAGNOSIS — Z79899 Other long term (current) drug therapy: Secondary | ICD-10-CM | POA: Diagnosis not present

## 2014-04-14 DIAGNOSIS — Z7901 Long term (current) use of anticoagulants: Secondary | ICD-10-CM | POA: Diagnosis not present

## 2014-04-14 DIAGNOSIS — M810 Age-related osteoporosis without current pathological fracture: Secondary | ICD-10-CM | POA: Diagnosis not present

## 2014-04-14 DIAGNOSIS — Z8546 Personal history of malignant neoplasm of prostate: Secondary | ICD-10-CM | POA: Insufficient documentation

## 2014-04-14 DIAGNOSIS — Z8679 Personal history of other diseases of the circulatory system: Secondary | ICD-10-CM | POA: Diagnosis not present

## 2014-04-14 DIAGNOSIS — M1612 Unilateral primary osteoarthritis, left hip: Secondary | ICD-10-CM | POA: Diagnosis not present

## 2014-04-14 DIAGNOSIS — M25552 Pain in left hip: Secondary | ICD-10-CM | POA: Diagnosis present

## 2014-04-14 MED ORDER — ACETAMINOPHEN 325 MG PO TABS
650.0000 mg | ORAL_TABLET | Freq: Once | ORAL | Status: AC
  Administered 2014-04-14: 650 mg via ORAL
  Filled 2014-04-14: qty 2

## 2014-04-14 NOTE — Discharge Instructions (Signed)
Return to the emergency room with worsening of symptoms, new symptoms or with symptoms that are concerning, especially unable to walk on left leg, severe pain, swelling redness, numbness, tingling or weakness. RICE: Rest, Ice (three cycles of 20 mins on, 35mins off at least twice a day), compression/brace, elevation.  Tylenol for pain. Do not take more than 8 500 mg tablets in a day. Never over 4 g in a day. Follow up with PCP if symptoms worsen or are persistent. Read below information and follow recommendations.  Hip Pain Your hip is the joint between your upper legs and your lower pelvis. The bones, cartilage, tendons, and muscles of your hip joint perform a lot of work each day supporting your body weight and allowing you to move around. Hip pain can range from a minor ache to severe pain in one or both of your hips. Pain may be felt on the inside of the hip joint near the groin, or the outside near the buttocks and upper thigh. You may have swelling or stiffness as well.  HOME CARE INSTRUCTIONS   Take medicines only as directed by your health care provider.  Apply ice to the injured area:  Put ice in a plastic bag.  Place a towel between your skin and the bag.  Leave the ice on for 15-20 minutes at a time, 3-4 times a day.  Keep your leg raised (elevated) when possible to lessen swelling.  Avoid activities that cause pain.  Follow specific exercises as directed by your health care provider.  Sleep with a pillow between your legs on your most comfortable side.  Record how often you have hip pain, the location of the pain, and what it feels like. SEEK MEDICAL CARE IF:   You are unable to put weight on your leg.  Your hip is red or swollen or very tender to touch.  Your pain or swelling continues or worsens after 1 week.  You have increasing difficulty walking.  You have a fever. SEEK IMMEDIATE MEDICAL CARE IF:   You have fallen.  You have a sudden increase in pain and  swelling in your hip. MAKE SURE YOU:   Understand these instructions.  Will watch your condition.  Will get help right away if you are not doing well or get worse. Document Released: 08/15/2009 Document Revised: 07/12/2013 Document Reviewed: 10/22/2012 [facility] Patient Information 2015 [place], [place]. This information is not intended to replace advice given to you by your health care provider. Make sure you discuss any questions you have with your health care provider.

## 2014-04-14 NOTE — Progress Notes (Signed)
Spoke with [facility] ED NP/PA about possible need of home health Pending CT results Orders maybe entered in by ED NP/PA

## 2014-04-14 NOTE — ED Provider Notes (Signed)
CSN: [id number]     Arrival date & time 04/14/14  1148 History   First MD Initiated Contact with Patient 04/14/14 1410     Chief Complaint  Patient presents with  . Hip Pain     (Consider location/radiation/quality/duration/timing/severity/associated sxs/prior Treatment) HPI  Dustin Bean is a 81 y.o. male with PMH of atrial fibrillation on warfarin, prostate cancer presenting with left hip pain for 1-2 days that is worse with movement and ambulation. He states at times the pain runs into his thigh. He denies any injury or fall. States the pain is so severe he can barely walk. He does use a walker. He denies any numbness, tingling, weakness. No erythema or edema. No fevers. Patient has taken Tylenol for pain with some relief. Pt warfarin checked 3 days ago and 3.2.   Past Medical History  Diagnosis Date  . Atrial fibrillation   . Cancer     prostate   Past Surgical History  Procedure Laterality Date  . Spine surgery      x 4   No family history on file. History  Substance Use Topics  . Smoking status: Never Smoker   . Smokeless tobacco: Not on file  . Alcohol Use: Yes     Comment: rarely    Review of Systems  Musculoskeletal: Negative for joint swelling.  Skin: Negative for color change and wound.  Neurological: Negative for weakness and numbness.      Allergies  Review of patient's allergies indicates no known allergies.  Home Medications   Prior to Admission medications   Medication Sig Start Date End Date Taking? Authorizing Provider  Ascorbic Acid (VITAMIN C) 1000 MG tablet Take 1,000 mg by mouth daily.   Yes Historical Provider, MD  atenolol (TENORMIN) 50 MG tablet Take 50 mg by mouth daily.   Yes Historical Provider, MD  Calcium-Magnesium-Vitamin D 400-166.7-133.3 MG-MG-UNIT TABS Take 1 tablet by mouth daily.   Yes Historical Provider, MD  cholecalciferol (VITAMIN D) 1000 UNITS tablet Take 2,000 Units by mouth daily.   Yes Historical Provider, MD   fexofenadine (ALLEGRA) 180 MG tablet Take 180 mg by mouth daily.   Yes Historical Provider, MD  fluticasone (FLONASE) 50 MCG/ACT nasal spray Place 2 sprays into both nostrils daily as needed for allergies.    Yes Historical Provider, MD  furosemide (LASIX) 20 MG tablet Take 20 mg by mouth daily.   Yes Historical Provider, MD  loperamide (IMODIUM) 2 MG capsule Take 1 capsule (2 mg total) by mouth 4 (four) times daily as needed for diarrhea or loose stools. 01/28/14  Yes Carlisle Cater, PA-C  lovastatin (MEVACOR) 40 MG tablet Take 40 mg by mouth at bedtime.   Yes Historical Provider, MD  Omega-3 Fatty Acids (FISH OIL) 1000 MG CAPS Take 1 capsule by mouth daily.   Yes Historical Provider, MD  omeprazole (PRILOSEC) 20 MG capsule Take 20 mg by mouth daily.   Yes Historical Provider, MD  potassium chloride SA (K-DUR,KLOR-CON) 20 MEQ tablet Take 20 mEq by mouth daily.   Yes Historical Provider, MD  tamsulosin (FLOMAX) 0.4 MG CAPS capsule Take 0.4 mg by mouth daily.   Yes Historical Provider, MD  warfarin (COUMADIN) 1 MG tablet Take 6-6.5 mg by mouth daily. Takes with 5mg  to get a total of 6mg  or 6.5mg   Takes 6mg  every other day, then takes 6.5mg  every other day   Yes Historical Provider, MD  warfarin (COUMADIN) 5 MG tablet Take 5 mg by mouth daily.  Yes Historical Provider, MD  Influenza vac split quadrivalent PF (FLUARIX) 0.5 ML injection Inject 0.5 mLs into the muscle once.    Historical Provider, MD   BP 128/84 mmHg  Pulse 67  Temp(Src) 97.7 F (36.5 C) (Oral)  Resp 20  SpO2 92% Physical Exam  Constitutional: He appears well-developed and well-nourished. No distress.  HENT:  Head: Normocephalic and atraumatic.  Eyes: Conjunctivae and EOM are normal. Right eye exhibits no discharge. Left eye exhibits no discharge.  Cardiovascular: Normal rate.   1+ pedal pulses equal bilaterally.  Pulmonary/Chest: Effort normal and breath sounds normal. No respiratory distress. He has no wheezes.  Abdominal:  Soft. There is no tenderness.  Musculoskeletal:  Anterior and posterior left thigh pain as well as left hip tenderness worse with abduction. Full range of motion. No redness, edema.  Neurological: He is alert. He exhibits normal muscle tone. Coordination normal.  5 over 5 strength in bilateral lower extremities. Decreased sensation to bilateral left and right foot Sensation intact at ankle.  Skin: Skin is warm and dry. He is not diaphoretic.  Nursing note and vitals reviewed.   ED Course  Procedures (including critical care time) Labs Review Labs Reviewed - No data to display  Imaging Review Ct Hip Left Wo Contrast  04/14/2014   CLINICAL DATA:  Left hip pain extending into the left thigh. No known trauma.  EXAM: CT OF THE LEFT HIP WITHOUT CONTRAST  TECHNIQUE: Multidetector CT imaging of the left hip was performed according to the standard protocol. Multiplanar CT image reconstructions were also generated.  COMPARISON:  Radiographs dated 04/14/2014. Also compared with sagittal and coronal images of the right hip performed on 04/14/2014.  FINDINGS: There is no fracture or dislocation or joint effusion. There are minimal degenerative changes of right hip with posterior joint space narrowing and minimal marginal osteophytes on the acetabulum. The visualized pelvic bones are intact.  IMPRESSION: No acute abnormality of the left hip. Slight degenerative arthritic changes.   Electronically Signed   By: Rozetta Nunnery M.D.   On: 04/14/2014 15:51   Dg Hip Unilat With Pelvis 2-3 Views Left  04/14/2014   CLINICAL DATA:  Pain normal in 2 days prior. Current pain with weight-bearing  EXAM: LEFT HIP (WITH PELVIS) 2-3 VIEWS  COMPARISON:  September 13, 2013  FINDINGS: Frontal pelvis as well as frontal and lateral left hip images were obtained. Bones are osteoporotic. There is mild narrowing of both hip joints. No fracture or dislocation. No erosive change. There is lower lumbar osteoarthritic change and dextroscoliosis.   IMPRESSION: No fracture or dislocation. Osteoporosis. Areas of osteoarthritic change, most marked in the visualized lumbar spine.   Electronically Signed   By: Lowella Grip M.D.   On: 04/14/2014 12:41     EKG Interpretation None      MDM   Final diagnoses:  Hip pain, left  Osteoporosis  Osteoarthritis of left hip, unspecified osteoarthritis type   Patient with left hip pain worse with ambulation. X-ray without acute fracture or dislocation. There is noted osteoporosis. Patient states he can barely walk. We'll proceed to CT of hip to evaluate for occult hip fracture. Patient's pain managed in ED. CT without evidence of fracture. Patient ambulatory in ED with walker. Steady gait. He states his pain is much improved. Patient stable for discharge to assisted living. Follow-up with primary care provider for persistent symptoms. Case management evaluated patient and arranged for a wheelchair to be delivered to assisted living. Order for home health  placed.  Discussed return precautions with patient. Discussed all results and patient verbalizes understanding and agrees with plan.  This is a shared patient. This patient was discussed with the physician who saw and evaluated the patient and agrees with the plan.     Pura Spice, PA-C 04/14/14 [phone]

## 2014-04-14 NOTE — ED Provider Notes (Signed)
Medical screening examination/treatment/procedure(s) were conducted as a shared visit with non-physician practitioner(s) and myself.  I personally evaluated the patient during the encounter.   EKG Interpretation None       Results for orders placed or performed in visit on 04/11/14  POCT INR  Result Value Ref Range   INR 3.2    Ct Hip Left Wo Contrast  04/14/2014   CLINICAL DATA:  Left hip pain extending into the left thigh. No known trauma.  EXAM: CT OF THE LEFT HIP WITHOUT CONTRAST  TECHNIQUE: Multidetector CT imaging of the left hip was performed according to the standard protocol. Multiplanar CT image reconstructions were also generated.  COMPARISON:  Radiographs dated 04/14/2014. Also compared with sagittal and coronal images of the right hip performed on 04/14/2014.  FINDINGS: There is no fracture or dislocation or joint effusion. There are minimal degenerative changes of right hip with posterior joint space narrowing and minimal marginal osteophytes on the acetabulum. The visualized pelvic bones are intact.  IMPRESSION: No acute abnormality of the left hip. Slight degenerative arthritic changes.   Electronically Signed   By: Rozetta Nunnery M.D.   On: 04/14/2014 15:51   Dg Hip Unilat With Pelvis 2-3 Views Left  04/14/2014   CLINICAL DATA:  Pain normal in 2 days prior. Current pain with weight-bearing  EXAM: LEFT HIP (WITH PELVIS) 2-3 VIEWS  COMPARISON:  September 13, 2013  FINDINGS: Frontal pelvis as well as frontal and lateral left hip images were obtained. Bones are osteoporotic. There is mild narrowing of both hip joints. No fracture or dislocation. No erosive change. There is lower lumbar osteoarthritic change and dextroscoliosis.  IMPRESSION: No fracture or dislocation. Osteoporosis. Areas of osteoarthritic change, most marked in the visualized lumbar spine.   Electronically Signed   By: Lowella Grip M.D.   On: 04/14/2014 12:41    Patient presents to the emergency department concerning for  left hip pain that radiates to the left thigh. There was no fall or injury. Patient states the pain is so bad that he can hardly walk and almost fell. Patient's x-rays of the hip are negative. Patient able to ambulate fine with a walker. Patient is on Coumadin INR is slightly above therapeutic at 3.2. Patient has doctor to follow-up with. Patient's daughter will be staying with him tonight. They will arrange for a wheelchair if needed. Patient improved significantly upon discharge. Patient alert and oriented no acute distress. Able to move the leg fine.  Fredia Sorrow, MD 04/14/14 [phone]

## 2014-04-14 NOTE — Progress Notes (Signed)
  CARE MANAGEMENT ED NOTE 04/14/2014  Patient:  Dustin Bean, Dustin Bean   Account Number:  [id number]  Date Initiated:  04/14/2014  Documentation initiated by:  Jackelyn Poling  Subjective/Objective Assessment:   81 yr old medicare pt informs CM he just moved from [place] to [place] pain in left hip and radiates down left thigh.  Pt denies any fall or injury. Pt states that now the pain is so bad he cant hardly walk and about fell.       Subjective/Objective Assessment Detail:   pcp in [place] Dr [name] and pt is interested in medicare pcps near [employer] [address] Sundra Aland CV  Pt reports his address in [place] is for his daughter "very supportive"  Pt states he feels he may need a wheelchair and confirms he has not had one in last 2-3 yrs from medicare wt 175-180 ht 6'1"  Reports he had a rolator at the assisted living facility only  [facility] of agency is [facility]     Action/Plan:   spoke with pt about pcp and possible need for home health, DME Discussed home health and DME with pt   Action/Plan Detail:   Anticipated DC Date:  04/14/2014     Status Recommendation to Physician:   Result of Recommendation:    Other ED Services  Consult [facility]  Other  Outpatient Services - Pt will follow up  PCP issues   PAC Choice  DURABLE MEDICAL EQUIPMENT   Choice offered to / List presented to:    DME arranged  [facility]     DME agency  [place].    Status of service:  Completed, signed off  ED Comments:   ED Comments Detail:  04/14/14 1500 CM spoke with Pura Spice DME coordinator of [facility] about needed wheelchair. Can be delivered to [facility]

## 2014-04-14 NOTE — Progress Notes (Signed)
Updated pt prior to leaving that his w/c would be delivered to [facility] or listed address in EPIC for him  Pt appreciative  Spoke with ED PA/NP Home health orders texted to [facility] staff, Cyril Mourning

## 2014-04-14 NOTE — ED Notes (Signed)
Pt states that day or two ago he started having pain in left hip and radiates down left thigh.  Pt denies any fall or injury. Pt states that now the pain is so bad he cant hardly walk and about fell.

## 2014-05-05 ENCOUNTER — Ambulatory Visit (INDEPENDENT_AMBULATORY_CARE_PROVIDER_SITE_OTHER): Payer: Medicare Other | Admitting: *Deleted

## 2014-05-05 DIAGNOSIS — I4891 Unspecified atrial fibrillation: Secondary | ICD-10-CM

## 2014-05-05 DIAGNOSIS — I482 Chronic atrial fibrillation, unspecified: Secondary | ICD-10-CM

## 2014-05-05 LAB — POCT INR: INR: 2.7

## 2014-05-05 NOTE — Progress Notes (Signed)
05/05/14 1253 Cm confirmed with Pura Spice of [facility] that w/c ordered on 04/14/14 and would be re ordered and processing checked Will be sent to address in EPIC for Pam.  Encouraged CM to offer daughter [facility] office number. CM informed her it was already provided and pcp is aware also Ht (5'6") & wt (186 lbs)  offered 1232 returned a call to Pam [phone] to update her that [place] not indicating need for motorized w/c, DME dept contacted, provided [facility] # [phone], discussed call to cheryl at pcp and informed they will assist if pt evaluated for motorized w/c.  CM informed Pam that pt will not be able to obtain an order from [place] (pt is no longer an ED pt and last seen 04/14/14 when original orders entered in [facility]) Referred her back to pcp for a motorized w/c Pam informed her [facility] will re process standard w/c order.  She voiced understanding but stated she did not want "Dr Robert's office to be involved"  Explained to her again that for the motorized w/c the pcp is the only provider that can assist for medicare guidelines.  She inquired about cost of motorized w/c CM discussed varying cost depending on the company obtained from Cm offered [employer] [facility] on [address] to assist her [phone] and [facility] [phone] Explained that it may take minimal a month plus for processing 1231 Cm spoke with Malachy Mood at Dr Mancel Bale [phone] about call from [facility] about motorized w/c for pt.  CM informed by Malachy Mood that she was aware because Pam had called Malachy Mood prior to calling CM.  Cheryl informed Pam pt did not qualify for  a motorized w/c.  CM shared with Malachy Mood what HHPT notes had indicated that pt was doing well with rolator Malachy Mood states she informed Pam pt would have to come to pcp to be evaluated for motorized w/c and processing may take 'a while" 1224 Paged [facility] staff [phone]  1205 ED CM spoke with Cyril Mourning of [facility] to get update on pt HHPT services, if indication of a  need for motorized w/c  Kristen unable to find a note indicating the need. Able to find notes of pt doing well with his rollator walker Pt saw Pt 05/04/14 Confirmed pt has to qualify for a motorized w/c and it takes minimal of a month for processing through pcp only. Encouraged CM to offer daughter [facility] office number and confirms standard w/c can be re processed if needed 1154 Pt's daughter, [employer], ( transferred to ED CM from Vivien Rota, Scientist, product/process development) about concern with DME.  Pt seen in ED on 04/14/14 d/c wt HHPT and W/c called in to [facility] by CM.  Daughter is requesting a "mobile", "electronic" w/c from CM.  CM explained to daughter that pt was ordered a standard w/c not a motorized w/c from [facility] ED and if a motorized w/c is desired it would have to be processed through the pcp. Cm reviewed EPIC listed pcp as Dr [name]. Daughter corrected CM to state that Dr [name] is not the pcp Reports to Cm Dr [name] 'is ill" and pt has changed to L[facility]ne Dy locally.  CM changed this in EPIC. She also discussed a light wt w/c and Cm informed a new order for a light wt w/c would need to be obtained from pcp not EDP (pt no longer a pt- last seen 21 days ago) Pam then changed her mind and stated "we will  take the standard wheelchair" and informed Cm she did not have to call pcp office when cm offered to call. CM discussed with Pam that pt was given [facility] number to f/u on delivery and he confirmed address in EPIC was Pam's address not [place]. Pam stated she lived either across or up the street for [place]. Confirms pt remains at [facility] and [place] states she wants pt to have a "mobile" w/c "to go to the dining room" Confirms pt has a rollator walker at the facility and "has trouble getting around"  and if he does not get something for mobility he may "have to go to another level and we do not want that" Informed CM pt is already being seen by HHPT.  CM inquired if HHPT has recommended pt to have a motorized  w/c Informed Pam generally HHPT can recommend DME if they have evaluated pt and a particular DME is needed.  CM informed Pam CM would f/u with [facility], re check ED order, and speak new listed pcp for possible assistance.  Confirmed daughter number as being correct in EPIC as [phone] so cm could call her back on a land line also because Daughter states she had difficulty hearing CM on CM mobile Flow manager transferred her to

## 2014-05-10 ENCOUNTER — Telehealth: Payer: Self-pay | Admitting: Cardiology

## 2014-05-10 NOTE — Telephone Encounter (Signed)
Error

## 2014-05-17 ENCOUNTER — Other Ambulatory Visit: Payer: Self-pay | Admitting: Dermatology

## 2014-05-17 DIAGNOSIS — C4490 Unspecified malignant neoplasm of skin, unspecified: Secondary | ICD-10-CM

## 2014-05-17 HISTORY — DX: Unspecified malignant neoplasm of skin, unspecified: C44.90

## 2014-05-23 ENCOUNTER — Ambulatory Visit (INDEPENDENT_AMBULATORY_CARE_PROVIDER_SITE_OTHER): Payer: Medicare Other | Admitting: Pharmacist

## 2014-05-23 DIAGNOSIS — I4891 Unspecified atrial fibrillation: Secondary | ICD-10-CM | POA: Diagnosis not present

## 2014-05-23 DIAGNOSIS — I482 Chronic atrial fibrillation, unspecified: Secondary | ICD-10-CM

## 2014-05-23 LAB — POCT INR: INR: 3.4

## 2014-06-06 ENCOUNTER — Ambulatory Visit (INDEPENDENT_AMBULATORY_CARE_PROVIDER_SITE_OTHER): Payer: Medicare Other

## 2014-06-06 DIAGNOSIS — I482 Chronic atrial fibrillation, unspecified: Secondary | ICD-10-CM

## 2014-06-06 DIAGNOSIS — I4891 Unspecified atrial fibrillation: Secondary | ICD-10-CM

## 2014-06-06 LAB — POCT INR: INR: 1.8

## 2014-06-20 ENCOUNTER — Ambulatory Visit (INDEPENDENT_AMBULATORY_CARE_PROVIDER_SITE_OTHER): Payer: Medicare Other | Admitting: *Deleted

## 2014-06-20 DIAGNOSIS — Z5181 Encounter for therapeutic drug level monitoring: Secondary | ICD-10-CM | POA: Diagnosis not present

## 2014-06-20 DIAGNOSIS — I482 Chronic atrial fibrillation, unspecified: Secondary | ICD-10-CM

## 2014-06-20 DIAGNOSIS — I4891 Unspecified atrial fibrillation: Secondary | ICD-10-CM

## 2014-06-20 LAB — POCT INR: INR: 2.5

## 2014-07-11 ENCOUNTER — Ambulatory Visit (INDEPENDENT_AMBULATORY_CARE_PROVIDER_SITE_OTHER): Payer: Medicare Other

## 2014-07-11 DIAGNOSIS — Z5181 Encounter for therapeutic drug level monitoring: Secondary | ICD-10-CM | POA: Diagnosis not present

## 2014-07-11 DIAGNOSIS — I482 Chronic atrial fibrillation, unspecified: Secondary | ICD-10-CM

## 2014-07-11 DIAGNOSIS — I4891 Unspecified atrial fibrillation: Secondary | ICD-10-CM

## 2014-07-11 LAB — POCT INR: INR: 2.4

## 2014-08-04 ENCOUNTER — Encounter (HOSPITAL_COMMUNITY): Payer: Self-pay | Admitting: Emergency Medicine

## 2014-08-04 ENCOUNTER — Emergency Department (INDEPENDENT_AMBULATORY_CARE_PROVIDER_SITE_OTHER)
Admission: EM | Admit: 2014-08-04 | Discharge: 2014-08-04 | Disposition: A | Discharge status: Home / Self Care | Payer: Medicare Other | Source: Home / Self Care | Attending: Family Medicine | Admitting: Family Medicine

## 2014-08-04 DIAGNOSIS — K1121 Acute sialoadenitis: Secondary | ICD-10-CM | POA: Diagnosis not present

## 2014-08-04 MED ORDER — AMOXICILLIN 500 MG PO CAPS: 500.0000 mg | ORAL_CAPSULE | Freq: Three times a day (TID) | ORAL | Status: DC

## 2014-08-04 NOTE — ED Provider Notes (Signed)
CSN: [id number]     Arrival date & time 08/04/14  1932 History   First MD Initiated Contact with Patient 08/04/14 1932     Chief Complaint  Patient presents with  . Jaw Pain   (Consider location/radiation/quality/duration/timing/severity/associated sxs/prior Treatment) Patient is a 81 y.o. male presenting with ear pain. The history is provided by the patient and a relative.  Otalgia Location:  Right Behind ear:  No abnormality Quality:  Sharp and throbbing Severity:  Mild Onset quality:  Gradual Duration:  2 weeks Progression:  Improving Chronicity:  New Relieved by:  None tried Worsened by:  Nothing tried Ineffective treatments:  None tried Associated symptoms: no ear discharge and no fever     Past Medical History  Diagnosis Date  . Atrial fibrillation   . Cancer     prostate   Past Surgical History  Procedure Laterality Date  . Spine surgery      x 4   No family history on file. History  Substance Use Topics  . Smoking status: Never Smoker   . Smokeless tobacco: Not on file  . Alcohol Use: Yes     Comment: rarely    Review of Systems  Constitutional: Negative.  Negative for fever.  HENT: Positive for ear pain and facial swelling. Negative for ear discharge.     Allergies  Review of patient's allergies indicates no known allergies.  Home Medications   Prior to Admission medications   Medication Sig Start Date End Date Taking? Authorizing Provider  amoxicillin (AMOXIL) 500 MG capsule Take 1 capsule (500 mg total) by mouth 3 (three) times daily. 08/04/14   Billy Fischer, MD  Ascorbic Acid (VITAMIN C) 1000 MG tablet Take 1,000 mg by mouth daily.    Historical Provider, MD  atenolol (TENORMIN) 50 MG tablet Take 50 mg by mouth daily.    Historical Provider, MD  Calcium-Magnesium-Vitamin D 400-166.7-133.3 MG-MG-UNIT TABS Take 1 tablet by mouth daily.    Historical Provider, MD  cholecalciferol (VITAMIN D) 1000 UNITS tablet Take 2,000 Units by mouth daily.     Historical Provider, MD  fexofenadine (ALLEGRA) 180 MG tablet Take 180 mg by mouth daily.    Historical Provider, MD  fluticasone (FLONASE) 50 MCG/ACT nasal spray Place 2 sprays into both nostrils daily as needed for allergies.     Historical Provider, MD  furosemide (LASIX) 20 MG tablet Take 20 mg by mouth daily.    Historical Provider, MD  Influenza vac split quadrivalent PF (FLUARIX) 0.5 ML injection Inject 0.5 mLs into the muscle once.    Historical Provider, MD  loperamide (IMODIUM) 2 MG capsule Take 1 capsule (2 mg total) by mouth 4 (four) times daily as needed for diarrhea or loose stools. 01/28/14   Carlisle Cater, PA-C  lovastatin (MEVACOR) 40 MG tablet Take 40 mg by mouth at bedtime.    Historical Provider, MD  Omega-3 Fatty Acids (FISH OIL) 1000 MG CAPS Take 1 capsule by mouth daily.    Historical Provider, MD  omeprazole (PRILOSEC) 20 MG capsule Take 20 mg by mouth daily.    Historical Provider, MD  potassium chloride SA (K-DUR,KLOR-CON) 20 MEQ tablet Take 20 mEq by mouth daily.    Historical Provider, MD  tamsulosin (FLOMAX) 0.4 MG CAPS capsule Take 0.4 mg by mouth daily.    Historical Provider, MD  warfarin (COUMADIN) 1 MG tablet Take 6-6.5 mg by mouth daily. Takes with 5mg  to get a total of 6mg  or 6.5mg   Takes 6mg  every other  day, then takes 6.5mg  every other day    Historical Provider, MD  warfarin (COUMADIN) 5 MG tablet Take 5 mg by mouth daily.    Historical Provider, MD   BP 143/85 mmHg  Pulse 85  Temp(Src) 97.7 F (36.5 C) (Oral)  Resp 18  SpO2 97% Physical Exam  Constitutional: He is oriented to person, place, and time. He appears well-developed and well-nourished. No distress.  HENT:  Head:    Right Ear: External ear normal.  Left Ear: External ear normal.  Mouth/Throat: Oropharynx is clear and moist.  Eyes: Conjunctivae are normal. Pupils are equal, round, and reactive to light.  Neck: Normal range of motion. Neck supple.  Neurological: He is alert and oriented  to person, place, and time.  Skin: Skin is warm and dry.  Nursing note and vitals reviewed.   ED Course  Procedures (including critical care time) Labs Review Labs Reviewed - No data to display  Imaging Review No results found.   MDM   1. Parotitis, acute       Billy Fischer, MD 08/04/14 2022

## 2014-08-04 NOTE — ED Notes (Addendum)
Pain under right ear, angle of jaw.  Onset 2-3 weeks ago.  Sore to touch

## 2014-08-04 NOTE — Discharge Instructions (Signed)
Heat, lemon candy, advil and antibiotic as prescribed. See dr bates next week if further problems

## 2014-08-11 ENCOUNTER — Ambulatory Visit (INDEPENDENT_AMBULATORY_CARE_PROVIDER_SITE_OTHER): Payer: Medicare Other | Admitting: *Deleted

## 2014-08-11 DIAGNOSIS — I482 Chronic atrial fibrillation, unspecified: Secondary | ICD-10-CM

## 2014-08-11 DIAGNOSIS — I4891 Unspecified atrial fibrillation: Secondary | ICD-10-CM | POA: Diagnosis not present

## 2014-08-11 DIAGNOSIS — Z5181 Encounter for therapeutic drug level monitoring: Secondary | ICD-10-CM

## 2014-08-11 LAB — POCT INR: INR: 2.8

## 2014-08-11 MED ORDER — WARFARIN SODIUM 5 MG PO TABS: 5.0000 mg | ORAL_TABLET | Freq: Every day | ORAL | Status: DC

## 2014-08-12 ENCOUNTER — Other Ambulatory Visit: Payer: Self-pay | Admitting: *Deleted

## 2014-08-12 MED ORDER — WARFARIN SODIUM 1 MG PO TABS: 6.0000 mg | ORAL_TABLET | Freq: Every day | ORAL | Status: AC

## 2014-08-12 MED ORDER — WARFARIN SODIUM 5 MG PO TABS: 5.0000 mg | ORAL_TABLET | Freq: Every day | ORAL | Status: AC

## 2014-08-17 LAB — PROTIME-INR: INR: 3.7 — AB (ref <=1.1)

## 2014-08-18 ENCOUNTER — Ambulatory Visit (INDEPENDENT_AMBULATORY_CARE_PROVIDER_SITE_OTHER): Payer: Medicare Other | Admitting: Cardiovascular Disease

## 2014-08-18 DIAGNOSIS — Z5181 Encounter for therapeutic drug level monitoring: Secondary | ICD-10-CM

## 2014-08-18 DIAGNOSIS — I4891 Unspecified atrial fibrillation: Secondary | ICD-10-CM

## 2014-08-24 ENCOUNTER — Other Ambulatory Visit (HOSPITAL_COMMUNITY)
Admission: RE | Admit: 2014-08-24 | Discharge: 2014-08-24 | Disposition: A | Discharge status: Home / Self Care | Payer: Medicare Other | Source: Ambulatory Visit | Attending: Otolaryngology | Admitting: Otolaryngology

## 2014-08-24 DIAGNOSIS — D110 Benign neoplasm of parotid gland: Secondary | ICD-10-CM | POA: Diagnosis present

## 2014-08-25 ENCOUNTER — Other Ambulatory Visit: Payer: Self-pay | Admitting: Otolaryngology

## 2014-08-25 DIAGNOSIS — C801 Malignant (primary) neoplasm, unspecified: Secondary | ICD-10-CM

## 2014-08-25 HISTORY — DX: Malignant (primary) neoplasm, unspecified: C80.1

## 2014-08-29 ENCOUNTER — Other Ambulatory Visit: Payer: Self-pay | Admitting: Otolaryngology

## 2014-08-29 DIAGNOSIS — K118 Other diseases of salivary glands: Secondary | ICD-10-CM

## 2014-08-31 ENCOUNTER — Ambulatory Visit (INDEPENDENT_AMBULATORY_CARE_PROVIDER_SITE_OTHER): Payer: Medicare Other | Admitting: *Deleted

## 2014-08-31 DIAGNOSIS — I482 Chronic atrial fibrillation, unspecified: Secondary | ICD-10-CM

## 2014-08-31 DIAGNOSIS — I4891 Unspecified atrial fibrillation: Secondary | ICD-10-CM

## 2014-08-31 DIAGNOSIS — Z5181 Encounter for therapeutic drug level monitoring: Secondary | ICD-10-CM | POA: Diagnosis not present

## 2014-08-31 LAB — POCT INR: INR: 2.2

## 2014-09-01 ENCOUNTER — Other Ambulatory Visit: Payer: Medicare Other

## 2014-09-02 ENCOUNTER — Other Ambulatory Visit: Payer: Medicare Other

## 2014-09-05 ENCOUNTER — Emergency Department (HOSPITAL_COMMUNITY): Payer: Medicare Other

## 2014-09-05 ENCOUNTER — Encounter (HOSPITAL_COMMUNITY): Payer: Self-pay | Admitting: *Deleted

## 2014-09-05 ENCOUNTER — Emergency Department (HOSPITAL_COMMUNITY)
Admission: EM | Admit: 2014-09-05 | Discharge: 2014-09-05 | Disposition: A | Discharge status: Home / Self Care | Payer: Medicare Other | Attending: Emergency Medicine | Admitting: Emergency Medicine

## 2014-09-05 DIAGNOSIS — K118 Other diseases of salivary glands: Secondary | ICD-10-CM | POA: Insufficient documentation

## 2014-09-05 DIAGNOSIS — Z7901 Long term (current) use of anticoagulants: Secondary | ICD-10-CM | POA: Diagnosis not present

## 2014-09-05 DIAGNOSIS — I4891 Unspecified atrial fibrillation: Secondary | ICD-10-CM | POA: Diagnosis not present

## 2014-09-05 DIAGNOSIS — Z8546 Personal history of malignant neoplasm of prostate: Secondary | ICD-10-CM | POA: Insufficient documentation

## 2014-09-05 DIAGNOSIS — Z79899 Other long term (current) drug therapy: Secondary | ICD-10-CM | POA: Insufficient documentation

## 2014-09-05 DIAGNOSIS — R51 Headache: Secondary | ICD-10-CM | POA: Diagnosis present

## 2014-09-05 LAB — BASIC METABOLIC PANEL
Anion gap: 4 — ABNORMAL LOW (ref 5–15)
BUN: 16 mg/dL (ref 6–20)
CO2: 29 mmol/L (ref 22–32)
Calcium: 8.9 mg/dL (ref 8.9–10.3)
Chloride: 107 mmol/L (ref 101–111)
Creatinine, Ser: 1.15 mg/dL (ref 0.61–1.24)
GFR, EST AFRICAN AMERICAN: 59 mL/min — AB (ref >60)
GFR, EST NON AFRICAN AMERICAN: 51 mL/min — AB (ref >60)
GLUCOSE: 101 mg/dL — AB (ref 65–99)
Potassium: 4.3 mmol/L (ref 3.5–5.1)
Sodium: 140 mmol/L (ref 135–145)

## 2014-09-05 LAB — CBC WITH DIFFERENTIAL/PLATELET
Basophils Absolute: 0 10*3/uL (ref 0.0–0.1)
Basophils Relative: 0 % (ref 0–1)
EOS PCT: 2 % (ref 0–5)
Eosinophils Absolute: 0.1 10*3/uL (ref 0.0–0.7)
HEMATOCRIT: 41 % (ref 39.0–52.0)
Hemoglobin: 13.8 g/dL (ref 13.0–17.0)
LYMPHS ABS: 0.8 10*3/uL (ref 0.7–4.0)
Lymphocytes Relative: 17 % (ref 12–46)
MCH: 33.1 pg (ref 26.0–34.0)
MCHC: 33.7 g/dL (ref 30.0–36.0)
MCV: 98.3 fL (ref 78.0–100.0)
Monocytes Absolute: 0.4 10*3/uL (ref 0.1–1.0)
Monocytes Relative: 8 % (ref 3–12)
Neutro Abs: 3.5 10*3/uL (ref 1.7–7.7)
Neutrophils Relative %: 73 % (ref 43–77)
Platelets: 127 10*3/uL — ABNORMAL LOW (ref 150–400)
RBC: 4.17 MIL/uL — ABNORMAL LOW (ref 4.22–5.81)
RDW: 14.2 % (ref 11.5–15.5)
WBC: 4.8 10*3/uL (ref 4.0–10.5)

## 2014-09-05 MED ORDER — IOHEXOL 300 MG/ML  SOLN
75.0000 mL | Freq: Once | INTRAMUSCULAR | Status: AC | PRN
  Administered 2014-09-05: 100 mL via INTRAVENOUS

## 2014-09-05 MED ORDER — TRAMADOL HCL 50 MG PO TABS: 25.0000 mg | ORAL_TABLET | Freq: Four times a day (QID) | ORAL | Status: DC | PRN

## 2014-09-05 NOTE — ED Notes (Signed)
Pt reports Dr bates aspirated fluid from lump behind RT ear.Pt presents today for a CT scan of same lump for further Dx.pain score 5/10 . Pt reports lump has been there for 4 weeks . Pt reports he can not sleep and his life is a mess because of lump.

## 2014-09-05 NOTE — ED Notes (Signed)
Pt back from CT scan

## 2014-09-05 NOTE — ED Provider Notes (Signed)
CSN: [id number]     Arrival date & time 09/05/14  2683 History   First MD Initiated Contact with Patient 09/05/14 [phone]     Chief Complaint  Patient presents with  . Pain    behind RT ear     (Consider location/radiation/quality/duration/timing/severity/associated sxs/prior Treatment) HPI Comments: Patient presents to the ER for evaluation of pain and swelling of the right side of his face. Patient reports that symptoms began last month. He was seen by his ENT doctor, Dr. Redmond Baseman. He reports that he had a fine-needle aspiration, but is unaware of results. Patient reports since then he has had progressive worsening of swelling and pain. He has not had any fevers. There is no drainage.   Past Medical History  Diagnosis Date  . Atrial fibrillation   . Cancer     prostate   Past Surgical History  Procedure Laterality Date  . Spine surgery      x 4   History reviewed. No pertinent family history. History  Substance Use Topics  . Smoking status: Never Smoker   . Smokeless tobacco: Not on file  . Alcohol Use: Yes     Comment: rarely    Review of Systems  Constitutional: Negative for fever.  HENT: Negative for trouble swallowing and voice change.   All other systems reviewed and are negative.     Allergies  Review of patient's allergies indicates no known allergies.  Home Medications   Prior to Admission medications   Medication Sig Start Date End Date Taking? Authorizing Provider  Ascorbic Acid (VITAMIN C) 1000 MG tablet Take 1,000 mg by mouth daily.   Yes Historical Provider, MD  atenolol (TENORMIN) 50 MG tablet Take 50 mg by mouth daily.   Yes Historical Provider, MD  Calcium-Magnesium-Vitamin D 400-166.7-133.3 MG-MG-UNIT TABS Take 1 tablet by mouth daily.   Yes Historical Provider, MD  cholecalciferol (VITAMIN D) 1000 UNITS tablet Take 1,000 Units by mouth daily.    Yes Historical Provider, MD  Cyanocobalamin (VITAMIN B-12) 2000 MCG TBCR Take 1 tablet by mouth.  07/30/10   Yes Historical Provider, MD  fexofenadine (ALLEGRA) 180 MG tablet Take 180 mg by mouth daily.   Yes Historical Provider, MD  fluticasone (FLONASE) 50 MCG/ACT nasal spray Place 2 sprays into both nostrils daily as needed for allergies.    Yes Historical Provider, MD  furosemide (LASIX) 20 MG tablet Take 20 mg by mouth daily.   Yes Historical Provider, MD  lovastatin (MEVACOR) 40 MG tablet Take 40 mg by mouth at bedtime.   Yes Historical Provider, MD  Omega-3 Fatty Acids (FISH OIL) 1000 MG CAPS Take 1 capsule by mouth daily.   Yes Historical Provider, MD  warfarin (COUMADIN) 5 MG tablet Take 1 tablet (5 mg total) by mouth daily. 08/12/14  Yes Darlin Coco, MD  loperamide (IMODIUM) 2 MG capsule Take 1 capsule (2 mg total) by mouth 4 (four) times daily as needed for diarrhea or loose stools. Patient not taking: Reported on 09/05/2014 01/28/14   Carlisle Cater, PA-C  warfarin (COUMADIN) 1 MG tablet Take 6-6.5 tablets (6-6.5 mg total) by mouth daily. Takes with 74m to get a total of 639mor 6.74m29mTakes 6mg80mery other day, then takes 6.74mg 40mry other day Patient not taking: Reported on 09/05/2014 08/12/14   ThomaDarlin Coco  BP 172/101 mmHg  Pulse 82  Temp(Src) 98 F (36.7 C) (Oral)  Resp 16  SpO2 96% Physical Exam  Constitutional: He is oriented to person,  place, and time. He appears well-developed and well-nourished. No distress.  HENT:  Head: Normocephalic and atraumatic.  Right Ear: Hearing, tympanic membrane and ear canal normal.  Left Ear: Hearing normal.  Nose: Nose normal.  Mouth/Throat: Oropharynx is clear and moist and mucous membranes are normal.  Eyes: Conjunctivae and EOM are normal. Pupils are equal, round, and reactive to light.  Neck: Normal range of motion. Neck supple. No edema, no erythema and normal range of motion present.    Cardiovascular: Regular rhythm, S1 normal and S2 normal.  Exam reveals no gallop and no friction rub.   No murmur heard. Pulmonary/Chest:  Effort normal and breath sounds normal. No respiratory distress. He exhibits no tenderness.  Abdominal: Soft. Normal appearance and bowel sounds are normal. There is no hepatosplenomegaly. There is no tenderness. There is no rebound, no guarding, no tenderness at McBurney's point and negative Murphy's sign. No hernia.  Musculoskeletal: Normal range of motion.  Neurological: He is alert and oriented to person, place, and time. He has normal strength. No cranial nerve deficit or sensory deficit. Coordination normal. GCS eye subscore is 4. GCS verbal subscore is 5. GCS motor subscore is 6.  Skin: Skin is warm, dry and intact. No rash noted. No cyanosis.  Psychiatric: He has a normal mood and affect. His speech is normal and behavior is normal. Thought content normal.  Nursing note and vitals reviewed.   ED Course  Procedures (including critical care time) Labs Review Labs Reviewed  CBC WITH DIFFERENTIAL/PLATELET - Abnormal; Notable for the following:    RBC 4.17 (*)    Platelets 127 (*)    All other components within normal limits  BASIC METABOLIC PANEL - Abnormal; Notable for the following:    Glucose, Bld 101 (*)    GFR calc non Af Amer 51 (*)    GFR calc Af Amer 59 (*)    Anion gap 4 (*)    All other components within normal limits    Imaging Review Ct Soft Tissue Neck W Contrast  09/05/2014   CLINICAL DATA:  Swelling behind the right ear, 2 months duration. Recent aspiration with atypical cells.  EXAM: CT NECK WITH CONTRAST  TECHNIQUE: Multidetector CT imaging of the neck was performed using the standard protocol following the bolus administration of intravenous contrast.  CONTRAST:  136m OMNIPAQUE IOHEXOL 300 MG/ML  SOLN  COMPARISON:  09/30/2006  FINDINGS: Pharynx and larynx: Normal  Salivary glands: Left parotid gland normal. Both submandibular glands normal. Mass within the inferior aspect of the superficial lobe of the parotid on the right with central low density. Maximal diameter  2.7 cm. Most consistent with primary parotid neoplasm. Inflamed lymph node less likely.  Thyroid: Normal  Lymph nodes: Slightly prominent level 4 lymph nodes on the right, the largest measuring 8 mm in diameter.  Vascular: Arterial and venous structures are patent. Non stenotic atherosclerotic disease at the carotid bifurcations.  Limited intracranial: Age related atrophy without focal finding.  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 Slightly prominent level 4 lymph nodes on the right, the largest measuring 8 mm.   Electronically Signed   By: MNelson ChimesM.D.   On: 09/05/2014 12:36     EKG Interpretation None  MDM   Final diagnoses:  None   parotid mass  Presented to the ER for evaluation of subjectively worsening swelling in the right parotid gland. Patient reports having seen Dr. Redmond Baseman, ENT previously for this. He had a fine-needle aspiration, is unaware of the results. He reports increased pain and swelling. Examination revealed a mobile and slightly tender mass in the area of the parotid gland without overlying skin changes. Airway is patent. CT scan performed, does show necrotic mass that is most consistent with cancer.  Dr. Janace Hoard, on-call for Dr. Redmond Baseman. He confirms that the fine-needle aspiration was suspicious for cancer. He also tells me that he met with the family and did give the results. Dr. Redmond Baseman is out of town the beginning part of this week, will see the patient in the office when he returns.  Just this with the family. They understand that there is nothing urgent or emergent needed at this time. When Dr. Redmond Baseman is back in the office, he will see the patient and discussed treatment options. He will be  given Ultram for pain.    Orpah Greek, MD 09/05/14 [phone]

## 2014-09-05 NOTE — ED Notes (Signed)
Pt refused yellow fall risk  Socks. Pt has yellow arm band on.

## 2014-09-05 NOTE — ED Notes (Signed)
Pt calling for nurse " I am ready for my CT scan" explained that CT transport will come when they are ready.

## 2014-09-08 ENCOUNTER — Emergency Department (HOSPITAL_COMMUNITY)
Admission: EM | Admit: 2014-09-08 | Discharge: 2014-09-08 | Disposition: A | Discharge status: Home / Self Care | Payer: Medicare Other | Attending: Emergency Medicine | Admitting: Emergency Medicine

## 2014-09-08 ENCOUNTER — Emergency Department (HOSPITAL_COMMUNITY): Payer: Medicare Other

## 2014-09-08 ENCOUNTER — Encounter (HOSPITAL_COMMUNITY): Payer: Self-pay

## 2014-09-08 ENCOUNTER — Inpatient Hospital Stay: Admission: RE | Admit: 2014-09-08 | Payer: Medicare Other | Source: Ambulatory Visit

## 2014-09-08 DIAGNOSIS — R0989 Other specified symptoms and signs involving the circulatory and respiratory systems: Secondary | ICD-10-CM | POA: Diagnosis not present

## 2014-09-08 DIAGNOSIS — R0902 Hypoxemia: Secondary | ICD-10-CM | POA: Insufficient documentation

## 2014-09-08 DIAGNOSIS — I499 Cardiac arrhythmia, unspecified: Secondary | ICD-10-CM | POA: Insufficient documentation

## 2014-09-08 DIAGNOSIS — I4891 Unspecified atrial fibrillation: Secondary | ICD-10-CM | POA: Diagnosis not present

## 2014-09-08 DIAGNOSIS — R410 Disorientation, unspecified: Secondary | ICD-10-CM | POA: Insufficient documentation

## 2014-09-08 DIAGNOSIS — R0602 Shortness of breath: Secondary | ICD-10-CM | POA: Diagnosis present

## 2014-09-08 DIAGNOSIS — Z7901 Long term (current) use of anticoagulants: Secondary | ICD-10-CM | POA: Insufficient documentation

## 2014-09-08 DIAGNOSIS — R5383 Other fatigue: Secondary | ICD-10-CM | POA: Insufficient documentation

## 2014-09-08 DIAGNOSIS — Z79899 Other long term (current) drug therapy: Secondary | ICD-10-CM | POA: Diagnosis not present

## 2014-09-08 DIAGNOSIS — R600 Localized edema: Secondary | ICD-10-CM | POA: Insufficient documentation

## 2014-09-08 DIAGNOSIS — Z8546 Personal history of malignant neoplasm of prostate: Secondary | ICD-10-CM | POA: Diagnosis not present

## 2014-09-08 DIAGNOSIS — Z7951 Long term (current) use of inhaled steroids: Secondary | ICD-10-CM | POA: Diagnosis not present

## 2014-09-08 LAB — CBC WITH DIFFERENTIAL/PLATELET
BASOS ABS: 0 10*3/uL (ref 0.0–0.1)
Basophils Relative: 0 % (ref 0–1)
EOS ABS: 0.1 10*3/uL (ref 0.0–0.7)
Eosinophils Relative: 1 % (ref 0–5)
HCT: 40.9 % (ref 39.0–52.0)
Hemoglobin: 13.8 g/dL (ref 13.0–17.0)
LYMPHS ABS: 0.6 10*3/uL — AB (ref 0.7–4.0)
Lymphocytes Relative: 11 % — ABNORMAL LOW (ref 12–46)
MCH: 33.5 pg (ref 26.0–34.0)
MCHC: 33.7 g/dL (ref 30.0–36.0)
MCV: 99.3 fL (ref 78.0–100.0)
MONO ABS: 0.4 10*3/uL (ref 0.1–1.0)
MONOS PCT: 7 % (ref 3–12)
NEUTROS ABS: 4.4 10*3/uL (ref 1.7–7.7)
NEUTROS PCT: 81 % — AB (ref 43–77)
Platelets: 147 10*3/uL — ABNORMAL LOW (ref 150–400)
RBC: 4.12 MIL/uL — ABNORMAL LOW (ref 4.22–5.81)
RDW: 14.5 % (ref 11.5–15.5)
WBC: 5.4 10*3/uL (ref 4.0–10.5)

## 2014-09-08 LAB — URINALYSIS, ROUTINE W REFLEX MICROSCOPIC
Bilirubin Urine: NEGATIVE
Glucose, UA: NEGATIVE mg/dL
HGB URINE DIPSTICK: NEGATIVE
KETONES UR: NEGATIVE mg/dL
LEUKOCYTES UA: NEGATIVE
NITRITE: NEGATIVE
PROTEIN: NEGATIVE mg/dL
SPECIFIC GRAVITY, URINE: 1.013 (ref 1.005–1.030)
UROBILINOGEN UA: 0.2 mg/dL (ref 0.0–1.0)
pH: 7 (ref 5.0–8.0)

## 2014-09-08 LAB — BRAIN NATRIURETIC PEPTIDE: B NATRIURETIC PEPTIDE 5: 261.8 pg/mL — AB (ref 0.0–100.0)

## 2014-09-08 LAB — I-STAT TROPONIN, ED: TROPONIN I, POC: 0 ng/mL (ref 0.00–0.08)

## 2014-09-08 LAB — PROTIME-INR
INR: 2.53 — ABNORMAL HIGH (ref 0.00–1.49)
Prothrombin Time: 26.9 seconds — ABNORMAL HIGH (ref 11.6–15.2)

## 2014-09-08 MED ORDER — FUROSEMIDE 10 MG/ML IJ SOLN
40.0000 mg | Freq: Once | INTRAMUSCULAR | Status: AC
  Administered 2014-09-08: 40 mg via INTRAVENOUS
  Filled 2014-09-08: qty 4

## 2014-09-08 NOTE — ED Notes (Signed)
Pt's daughter found him in his room in an altered mental state. Upon EMS arrival, pt was off his home oxygen with an O2 sat in the low 80's. EMS also found patient in A-fib with a rate in the 90's-110 bpm. Pt was placed on O2 and quickly returned to his normal mental baseline. Upon arrival to the ER, patient has no complaints and stated that he felt fine.

## 2014-09-08 NOTE — Discharge Instructions (Signed)
Pulmonary Edema ° °Pulmonary edema (PE) is a condition in which fluid collects in the lungs. This makes it hard to breathe. PE may be a result of the heart not pumping very well or a result of injury.  °CAUSES  °· Coronary artery disease causes blockages in the arteries of the heart. This deprives the heart muscle of oxygen and weakens the muscle. A heart attack is a form of coronary artery disease. °· High blood pressure causes the heart muscle to work harder than usual. Over time, the heart muscle may get stiff, and it starts to work less efficiently. It may also fatigue and weaken. °· Viral infection of the heart (myocarditis) may weaken the heart muscle. °· Metabolic conditions such as thyroid disease, excessive alcohol use, certain vitamin deficiencies, or diabetes may also weaken the heart muscle. °· Leaky or stiff heart valves may impair normal heart function. °· Lung disease may strain the heart muscle. °· Excessive demands on the heart such as too much salt or fluid intake. °· Failure to take prescribed medicines. °· Lung injury from heat or toxins, such as poisonous gas. °· Infection in the lungs or other parts of the body. °· Fluid overload caused by kidney failure or medicines. °SYMPTOMS  °· Shortness of breath at rest or with exertion. °· Grunting, wheezing, or gurgling while breathing. °· Feeling like you cannot get enough air. °· Breaths are shallow and fast. °· A lot of coughing with frothy or bloody mucus. °· Skin may become cool, damp, and turn a pale or bluish color. °DIAGNOSIS  °Initial diagnosis may be based on your history, symptoms, and a physical examination. Additional tests for PE may include: °· Electrocardiography. °· Chest X-ray. °· Blood tests. °· Stress test. °· Ultrasound evaluation of the heart (echocardiography). °· Evaluation by a heart doctor (cardiologist). °· Test of the heart arteries to look for blockages (angiography). °· Check of blood oxygen. °TREATMENT  °Treatment of PE  will depend on the underlying cause and will focus first on relieving the symptoms.  °· Extra oxygen to make breathing easier and assist with removing mucus. This may include breathing treatments or a tube into the lungs and a breathing machine. °· Medicine to help the body get rid of extra water, usually through an IV tube. °· Medicine to help the heart pump better. °· If poor heart function is the cause, treatment may include: °¨ Procedures to open blocked arteries, repair damaged heart valves, or remove some of the damaged heart muscle. °¨ A pacemaker to help the heart pump with less effort. °HOME CARE INSTRUCTIONS  °· Your health care provider will help you determine what type of exercise program may be helpful. It is important to maintain strength and increase it if possible. Pace your activities to avoid shortness of breath or chest pain. Rest for at least 1 hour before and after meals. Cardiac rehabilitation programs are available in some locations. °· Eat a heart-healthy diet low in salt, saturated fat, and cholesterol. Ask for help with choices. °· Make a list of every medicine, vitamin, or herbal supplement you are taking. Keep the list with you at all times. Show it to your health care provider at every visit and before starting a new medicine. Keep the list up to date. °· Ask your health care provider or pharmacist to help you write a plan or schedule so that you know things about each medicine such as: °¨ Why you are taking it. °¨ The possible side effects. °¨   The best time of day to take it. °¨ Foods to take with it or avoid. °¨ When to stop taking it. °· Record your hospital or clinic weight. When you get home, compare it to your scale and record your weight. Then, weigh yourself first thing in the morning daily, and record the weights. You should weigh yourself every morning after you urinate and before you eat breakfast. Wear the same amount of clothing each time you weigh yourself. Provide your  health care provider with your weight record. Daily weights are important in the early recognition of excess fluid. Tell your health care provider right away if you have gained 03 lb/1.4 kg in 1 day, 05 lb/2.3 kg in a week, or as directed by your health care provider. Your medicines may need to be adjusted. °· Blood pressure monitoring should be done as often as directed. You can get a home blood pressure cuff at your drugstore. Record these values and bring them with you for your clinic visits. Notify your health care provider if you become dizzy or light-headed when standing up. °· If you are currently a smoker, it is time to quit. Nicotine makes your heart work harder and is one of the leading causes of cardiac deaths. Do not use nicotine gum or patches before talking to your doctor. °· Make a follow-up appointment with your health care provider as directed. °· Ask your health care provider for a copy of your latest heart tracing (ECG) and keep a copy with you at all times. °SEEK IMMEDIATE MEDICAL CARE IF:  °· You have severe chest pain, especially if the pain is crushing or pressure-like and spreads to the arms, back, neck, or jaw. THIS IS AN EMERGENCY. Do not wait to see if the pain will go away. Call for local emergency medical help. Do not drive yourself to the hospital. °· You have sweating, feel sick to your stomach (nauseous), or are experiencing shortness of breath. °· Your weight increases by 03 lb/1.4 kg in 1 day or 05 lb/2.3 kg in a week. °· You notice increasing shortness of breath that is unusual for you. This may happen during rest, sleep, or with activity. °· You develop chest pain (angina) or pain that is unusual for you. °· You notice more swelling in your hands, feet, ankles, or abdomen. °· You notice lasting (persistent) dizziness, blurred vision, headache, or unsteadiness. °· You begin to cough up bloody mucus (sputum). °· You are unable to sleep because it is hard to breathe. °· You begin to  feel a "jumping" or "fluttering" sensation (palpitations) in the chest that is unusual for you. °MAKE SURE YOU: °· Understand these instructions. °· Will watch your condition. °· Will get help right away if you are not doing well or get worse. °Document Released: 05/18/2002 Document Revised: 03/02/2013 Document Reviewed: 11/02/2012 °ExitCare® Patient Information ©2015 [employer]. This information is not intended to replace advice given to you by your health care provider. Make sure you discuss any questions you have with your health care provider. ° °

## 2014-09-08 NOTE — ED Provider Notes (Signed)
CSN: [id number]     Arrival date & time 09/08/14  0453 History   First MD Initiated Contact with Patient 09/08/14 [phone]     Chief Complaint  Patient presents with  . Atrial Fibrillation     (Consider location/radiation/quality/duration/timing/severity/associated sxs/prior Treatment) HPI Patient with history of atrial fibrillation on Coumadin. EMS was called after patient was found in his room by his daughter with increased shortness of breath, mild confusion. Patient normally wears 2 L of oxygen and night and did not (last night. EMS found patient with saturations in the low 80s. Place the patient on oxygen improvement of saturations and mental status. Noted to be in atrial fibrillation with normal rate and normal blood pressure. Patient states he's had increased fatigue recently. He denies any pain specifically any headache, chest pain, abdominal pain. He denies shortness of breath. Past Medical History  Diagnosis Date  . Atrial fibrillation   . Cancer     prostate   Past Surgical History  Procedure Laterality Date  . Spine surgery      x 4   History reviewed. No pertinent family history. History  Substance Use Topics  . Smoking status: Never Smoker   . Smokeless tobacco: Never Used  . Alcohol Use: Yes     Comment: rarely    Review of Systems  Constitutional: Positive for fatigue. Negative for fever and chills.  Respiratory: Negative for cough and shortness of breath.   Cardiovascular: Negative for chest pain, palpitations and leg swelling.  Gastrointestinal: Negative for nausea, vomiting, abdominal pain, diarrhea and constipation.  Musculoskeletal: Negative for back pain, neck pain and neck stiffness.  Skin: Negative for rash and wound.  Neurological: Negative for dizziness, weakness, light-headedness, numbness and headaches.  All other systems reviewed and are negative.     Allergies  Review of patient's allergies indicates no known allergies.  Home Medications    Prior to Admission medications   Medication Sig Start Date End Date Taking? Authorizing Provider  Ascorbic Acid (VITAMIN C) 1000 MG tablet Take 1,000 mg by mouth daily.    Historical Provider, MD  atenolol (TENORMIN) 50 MG tablet Take 50 mg by mouth daily.    Historical Provider, MD  Calcium-Magnesium-Vitamin D 400-166.7-133.3 MG-MG-UNIT TABS Take 1 tablet by mouth daily.    Historical Provider, MD  cholecalciferol (VITAMIN D) 1000 UNITS tablet Take 1,000 Units by mouth daily.     Historical Provider, MD  Cyanocobalamin (VITAMIN B-12) 2000 MCG TBCR Take 1 tablet by mouth.  07/30/10   Historical Provider, MD  fexofenadine (ALLEGRA) 180 MG tablet Take 180 mg by mouth daily.    Historical Provider, MD  fluticasone (FLONASE) 50 MCG/ACT nasal spray Place 2 sprays into both nostrils daily as needed for allergies.     Historical Provider, MD  furosemide (LASIX) 20 MG tablet Take 20 mg by mouth daily.    Historical Provider, MD  lovastatin (MEVACOR) 40 MG tablet Take 40 mg by mouth at bedtime.    Historical Provider, MD  Omega-3 Fatty Acids (FISH OIL) 1000 MG CAPS Take 1 capsule by mouth daily.    Historical Provider, MD  traMADol (ULTRAM) 50 MG tablet Take 0.5-1 tablets (25-50 mg total) by mouth every 6 (six) hours as needed. 09/05/14   Orpah Greek, MD  warfarin (COUMADIN) 1 MG tablet Take 6-6.5 tablets (6-6.5 mg total) by mouth daily. Takes with 5mg  to get a total of 6mg  or 6.5mg   Takes 6mg  every other day, then takes 6.5mg  every other day  Patient not taking: Reported on 09/05/2014 08/12/14   Darlin Coco, MD  warfarin (COUMADIN) 5 MG tablet Take 1 tablet (5 mg total) by mouth daily. 08/12/14   Darlin Coco, MD   BP 132/89 mmHg  Pulse 34  Temp(Src) 98 F (36.7 C) (Oral)  Resp 17  SpO2 85% Physical Exam  Constitutional: He is oriented to person, place, and time. He appears well-developed and well-nourished. No distress.  HENT:  Head: Normocephalic and atraumatic.  Mouth/Throat:  Oropharynx is clear and moist.  Eyes: EOM are normal. Pupils are equal, round, and reactive to light.  Neck: Normal range of motion. Neck supple.  Cardiovascular: Normal rate.   Irregularly irregular  Pulmonary/Chest: Effort normal and breath sounds normal. No respiratory distress. He has no wheezes. He has no rales.  Abdominal: Soft. Bowel sounds are normal. He exhibits no distension and no mass. There is no tenderness. There is no rebound and no guarding.  Musculoskeletal: Normal range of motion. He exhibits edema. He exhibits no tenderness.  1+ bilateral pretibial pitting edema  Neurological: He is alert and oriented to person, place, and time.  Moves all extremities without deficit. Sensation is grossly intact.  Skin: Skin is warm and dry. No rash noted. No erythema.  Psychiatric: He has a normal mood and affect. His behavior is normal.  Nursing note and vitals reviewed.   ED Course  Procedures (including critical care time) Labs Review Labs Reviewed  CBC WITH DIFFERENTIAL/PLATELET - Abnormal; Notable for the following:    RBC 4.12 (*)    Platelets 147 (*)    Neutrophils Relative % 81 (*)    Lymphocytes Relative 11 (*)    Lymphs Abs 0.6 (*)    All other components within normal limits  PROTIME-INR - Abnormal; Notable for the following:    Prothrombin Time 26.9 (*)    INR 2.53 (*)    All other components within normal limits  BRAIN NATRIURETIC PEPTIDE - Abnormal; Notable for the following:    B Natriuretic Peptide 261.8 (*)    All other components within normal limits  URINALYSIS, ROUTINE W REFLEX MICROSCOPIC (NOT AT [facility])  I-STAT TROPOININ, ED    Imaging Review No results found.   EKG Interpretation   Date/Time:  Thursday September 08 2014 05:04:55 EDT Ventricular Rate:  76 PR Interval:    QRS Duration: 134 QT Interval:  413 QTC Calculation: 464 R Axis:   75 Text Interpretation:  Atrial fibrillation Right bundle branch block  Confirmed by Garima Chronis  MD, Jalina Blowers  ([id number]) on 09/08/2014 6:08:02 AM      MDM   Final diagnoses:  Hypoxia  Pulmonary congestion   Patient with elevated BNP and chest x-ray with mild congestion. We'll give dose of IV Lasix in the emergency department but anticipate discharge home. Patient is feeling much better at this time. Continues to deny any pain.  Signed out to oncoming EDP pending re-eval after Lasix.       Julianne Rice, MD 09/10/14 [phone]

## 2014-09-08 NOTE — ED Provider Notes (Signed)
9:16 AM patient remained stable, feeling much better. He and his family would like to go home. VSS.  He states that he has an appointment at 10 AM with his ear, nose and throat doctor. Return precautions provided.  Clinical Impression 1. Pulmonary congestion   2. Hypoxia      Pamella Pert, MD 09/08/14 [phone]

## 2014-09-13 ENCOUNTER — Other Ambulatory Visit: Payer: Self-pay | Admitting: Otolaryngology

## 2014-09-13 DIAGNOSIS — K118 Other diseases of salivary glands: Secondary | ICD-10-CM

## 2014-09-15 ENCOUNTER — Encounter (HOSPITAL_COMMUNITY)
Admission: RE | Admit: 2014-09-15 | Discharge: 2014-09-15 | Disposition: A | Discharge status: Home / Self Care | Payer: Medicare Other | Source: Ambulatory Visit | Attending: Otolaryngology | Admitting: Otolaryngology

## 2014-09-15 DIAGNOSIS — R220 Localized swelling, mass and lump, head: Secondary | ICD-10-CM | POA: Insufficient documentation

## 2014-09-15 DIAGNOSIS — K118 Other diseases of salivary glands: Secondary | ICD-10-CM

## 2014-09-15 LAB — GLUCOSE, CAPILLARY: Glucose-Capillary: 84 mg/dL (ref 65–99)

## 2014-09-15 MED ORDER — FLUDEOXYGLUCOSE F - 18 (FDG) INJECTION
9.3200 | Freq: Once | INTRAVENOUS | Status: AC | PRN
  Administered 2014-09-15: 9.31 via INTRAVENOUS

## 2014-09-19 ENCOUNTER — Telehealth: Payer: Self-pay | Admitting: Cardiology

## 2014-09-19 NOTE — Telephone Encounter (Signed)
Request for surgical clearance:  1. What type of surgery is being performed? Rt carotidectomy   2. When is this surgery scheduled? 8/3  3. Are there any medications that need to be held prior to surgery and how long? Maybe coumadin  4. Name of physician performing surgery? [facility]  5. What is your office phone and fax number? Fax [phone]  6.

## 2014-09-19 NOTE — Telephone Encounter (Signed)
Will forward to  Dr. Brackbill for review 

## 2014-09-19 NOTE — Telephone Encounter (Signed)
Patient has not been seen since November 2015.  We showed see him for an office visit to assess preoperative risk.

## 2014-09-19 NOTE — Telephone Encounter (Signed)
Left message for daughter Dustin Bean to call back, no answer at patients number Ok to work in on Thursday

## 2014-09-20 ENCOUNTER — Ambulatory Visit (HOSPITAL_COMMUNITY): Payer: Medicare Other

## 2014-09-20 NOTE — Telephone Encounter (Signed)
Left message with appointment date and time

## 2014-09-20 NOTE — Telephone Encounter (Signed)
Follow Up        Pt's daughter calling back to speak to [facility] about an appt for pt. Please call back and advise.

## 2014-09-20 NOTE — Telephone Encounter (Signed)
New Message       Pt's daughter returning [place] phone call. She states any appt on any day this week and any time is ok. If you are unable to reach her it is ok to leave detailed message with appt info on vm. Please call back and advise.

## 2014-09-21 ENCOUNTER — Telehealth: Payer: Self-pay | Admitting: Cardiology

## 2014-09-21 ENCOUNTER — Ambulatory Visit (INDEPENDENT_AMBULATORY_CARE_PROVIDER_SITE_OTHER): Payer: Medicare Other | Admitting: *Deleted

## 2014-09-21 DIAGNOSIS — I482 Chronic atrial fibrillation, unspecified: Secondary | ICD-10-CM

## 2014-09-21 DIAGNOSIS — I4891 Unspecified atrial fibrillation: Secondary | ICD-10-CM

## 2014-09-21 DIAGNOSIS — Z5181 Encounter for therapeutic drug level monitoring: Secondary | ICD-10-CM

## 2014-09-21 LAB — POCT INR: INR: 1.9

## 2014-09-21 NOTE — Telephone Encounter (Signed)
Patient was seen in the CVRR clinic today. Please see anticoagulant track.

## 2014-09-21 NOTE — Telephone Encounter (Signed)
New Message   The pt cancerous tumor surgery will be rescheduled   Pt will come into the office for coumition 09/22/14 and will keep the appt for Dr.Brackbill

## 2014-09-21 NOTE — Telephone Encounter (Signed)
Routed to Dr. Mare Ferrari, Candance Hemphill and Alvina Filbert

## 2014-09-21 NOTE — Telephone Encounter (Signed)
New Message   Pt daughter was told trhat appt for Coumadin was cancled for today and she stated she didn't care that her father wants to be seen today and not tomorrow  I called Candace in [place] and told her that he wants to be seen today, per candace that it was fine  Called the pt daughter back to pick a time for today and she said i dont know what time to pick and to just go with whatever it was yesterday, she didn't schedule a time because she said pt was already here or on route to [facility]

## 2014-09-22 ENCOUNTER — Encounter: Payer: Self-pay | Admitting: Cardiology

## 2014-09-22 ENCOUNTER — Ambulatory Visit (INDEPENDENT_AMBULATORY_CARE_PROVIDER_SITE_OTHER): Payer: Medicare Other | Admitting: Cardiology

## 2014-09-22 DIAGNOSIS — I4510 Unspecified right bundle-branch block: Secondary | ICD-10-CM

## 2014-09-22 DIAGNOSIS — I482 Chronic atrial fibrillation, unspecified: Secondary | ICD-10-CM

## 2014-09-22 NOTE — Progress Notes (Signed)
Cardiology Office Note   Date:  09/22/2014   ID:  Dustin Bean, DOB 08-17-1913, MRN [id number]  PCP:  Myriam Jacobson, MD  Cardiologist: Darlin Coco MD  No chief complaint on file.     History of Present Illness: Dustin Bean is a 81 y.o. male who presents for cardiology follow-up visit.  He is a resident  at [facility] He is the father of [name]. The patient has been a resident at [facility] for the past 3 months. Prior to that he was living in [place]. His PCP Dr. [name] is in [place]. He intends to continue to use Dr. [name] for his PCP. He previously had been going to [facility] to see Dr. [name] in cardiology. Dr. [name] has been treating him for his atrial fibrillation for about 10 years. He is on rate control and anticoagulation. He has been on long-term Coumadin. He has tolerated it well. He has not had any history of TIA or stroke. He does not have any history of ischemic heart disease. He does have a history of hypercholesterolemia and is on lovastatin. He has a history of osteoarthritis and is on glucosamine. He has short-term memory disorder and is on donepezil 10 mg tablet taking 5 mg at bedtime. He ambulates with the help of a rolling walker. He has a history of urinary incontinence and wears depends. He has had peripheral edema and takes furosemide 20 mg daily. He does not think he could tolerate any higher dose than that. Since last visit the patient had an echocardiogram on 02/14/14 which demonstrated: - Left ventricle: The cavity size was normal. Wall thickness was increased in a pattern of mild LVH. Systolic function was normal. The estimated ejection fraction was in the range of 60% to 65%. Wall motion was normal; there were no regional wall motion abnormalities. The study is not technically sufficient to allow evaluation of LV diastolic function. - Aortic valve: Trileaflet; mildly calcified leaflets. There was  no stenosis. There was mild regurgitation. - Mitral valve: Sclerotic leaflet tips. Mild regurgitation. - Left atrium: Moderately dilated at 42 ml/m2. - Right ventricle: The cavity size was mildly dilated. - Right atrium: Moderately dilated at 25 cm2. - Tricuspid valve: There was moderate regurgitation. - Pulmonary arteries: PA peak pressure: 50 mm Hg (S).  Impressions:  - Compared to the echo in 2008, the aortic valve appears stable. There is now mild MR, moderate biatrial enlargment, moderate TR and an RVSP of 50 mmHg, consistent with moderate pulmonary hypertension.  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  It shows hyper uptake on PET scan.  Initially the patient was considering surgery but now has decided to consider radiation instead.  Past Medical History  Diagnosis Date  . Atrial fibrillation   . Cancer     prostate    Past Surgical History  Procedure Laterality Date  . Spine surgery      x 4     Current Outpatient Prescriptions  Medication Sig Dispense Refill  . Ascorbic Acid (VITAMIN C) 1000 MG tablet Take 1,000 mg by mouth daily.    Marland Kitchen atenolol (TENORMIN) 50 MG tablet Take 50 mg by mouth daily.    . cholecalciferol (VITAMIN D) 1000 UNITS tablet Take 1,000 Units by mouth daily.     . Cyanocobalamin (VITAMIN B-12) 2000 MCG TBCR Take 1 tablet by mouth.     . fexofenadine (ALLEGRA) 180 MG tablet Take 180 mg by  mouth daily.    . furosemide (LASIX) 20 MG tablet Take 20 mg by mouth daily.    Marland Kitchen lovastatin (MEVACOR) 40 MG tablet Take 40 mg by mouth at bedtime.    . Omega-3 Fatty Acids (FISH OIL) 1000 MG CAPS Take 1 capsule by mouth daily.    Marland Kitchen warfarin (COUMADIN) 1 MG tablet Take 6-6.5 tablets (6-6.5 mg total) by mouth daily. Takes with 5mg  to get a total of 6mg  or 6.5mg   Takes 6mg  every other day, then takes 6.5mg  every other day 30 tablet 1  . warfarin (COUMADIN) 5 MG tablet Take 1 tablet (5 mg total) by mouth daily. 40 tablet 3   No current  facility-administered medications for this visit.    Allergies:   Review of patient's allergies indicates no known allergies.    Social History:  The patient  reports that he has never smoked. He has never used smokeless tobacco. He reports that he drinks alcohol. He reports that he does not use illicit drugs.   Family History:  The patient's family history includes Kidney failure in his father.    ROS:  Please see the history of present illness.   Otherwise, review of systems are positive for none.   All other systems are reviewed and negative.    PHYSICAL EXAM: VS:  BP 120/70 mmHg  Pulse 80  Ht 6\' 1"  (1.854 m)  Wt 193 lb 12.8 oz (87.907 kg)  BMI 25.57 kg/m2 , BMI Body mass index is 25.57 kg/(m^2). GEN: Well nourished, well developed, in no acute distress HEENT: normal Neck: no JVD, carotid bruits, or masses Cardiac: Irregularly irregular no murmurs, rubs, or gallops, there is moderate edema. Respiratory:  clear to auscultation bilaterally, normal work of breathing GI: soft, nontender, nondistended, + BS MS: no deformity or atrophy Skin: warm and dry, no rash Neuro:  Strength and sensation are intact Psych: euthymic mood, full affect   EKG:  EKG is ordered today. The ekg ordered today demonstrates atrial fibrillation with heart rate 80 bpm.  Right bundle branch block.  Since prior tracing of 08/12/14, no significant change   Recent Labs: 01/28/2014: ALT 16 09/05/2014: BUN 16; Creatinine, Ser 1.15; Potassium 4.3; Sodium 140 09/08/2014: B Natriuretic Peptide 261.8*; Hemoglobin 13.8; Platelets 147*    Lipid Panel No results found for: CHOL, TRIG, HDL, CHOLHDL, VLDL, LDLCALC, LDLDIRECT    Wt Readings from Last 3 Encounters:  09/22/14 193 lb 12.8 oz (87.907 kg)  02/01/14 186 lb (84.369 kg)         ASSESSMENT AND PLAN:  1. Chronic atrial fibrillation treated with rate control and warfarin anticoagulation. 2. Right bundle branch block 3. Peripheral edema 4.  Urinary incontinence 5. Short-term memory disorder 6.  Hypermetabolic right parotid mass  Disposition: The patient is to continue current medication.  He will be talking with his ENT physician Dr. Redmond Baseman about possible radiation therapy rather than surgical excision.  From the cardiac standpoint he will continue on long-term warfarin for his atrial fibrillation.  Continue close follow-up in the Coumadin clinic.  Recheck here in 6 months for office visit.   Current medicines are reviewed at length with the patient today.  The patient does not have concerns regarding medicines.  The following changes have been made:  no change  Labs/ tests ordered today include:   Orders Placed This Encounter  Procedures  . EKG 12-Lead       Signed, Darlin Coco MD 09/22/2014 1:24 PM    [facility] [address], [place], [place]  [address] Phone: [phone]; Fax: [phone]

## 2014-09-22 NOTE — Patient Instructions (Signed)
Medication Instructions:  Your physician recommends that you continue on your current medications as directed. Please refer to the Current Medication list given to you today.  Labwork: none  Testing/Procedures: none  Follow-Up: Your physician wants you to follow-up in: 6 month ov You will receive a reminder letter in the mail two months in advance. If you don't receive a letter, please call our office to schedule the follow-up appointment.  Any Other Special Instructions Will Be Listed Below (If Applicable).

## 2014-09-27 ENCOUNTER — Encounter: Payer: Self-pay | Admitting: Radiation Oncology

## 2014-09-27 ENCOUNTER — Telehealth: Payer: Self-pay | Admitting: *Deleted

## 2014-09-27 NOTE — Telephone Encounter (Signed)
  Oncology Nurse Navigator Documentation  Referral date to RadOnc/MedOnc: 09/23/14 (09/27/14 1459) Navigator Encounter Type: Introductory phone call (09/27/14 1459)   Placed introductory call to new referral patient.  Also called grandson. 1. Introduced myself as the oncology nurse navigator that works with Dr. Isidore Moos to whom he has been referred by [facility]. 2. He confirmed understanding of referral and appt date/time for tomorrow.  He indicated he will be accompanied by his grandson. 3. I briefly explained my role as a navigator, indicated that I would be joining them during his appt. 4. I confirmed understanding of the [facility] location, explained arrival and RadOnc registration process for appt. 5. I provided my contact information, encouraged them to call with questions/concerns before appt. 6. They verbalized understanding of information provided, expressed appreciation for my call.  Gayleen Orem, RN, BSN, [place] [facility] [place] [phone]

## 2014-09-27 NOTE — Progress Notes (Signed)
Head and Neck Cancer Location of Tumor / Histology:  Necrotic mass right parotid tail 2x2 firm area  Patient presented months ago with symptoms of: tenderness and swelling right side 3-4 months   Biopsies of  (if applicable) revealed: 09/10/48 :Diagnosis PAROTID GLAND, FINE NEEDLE ASPIRATION, RIGHT TAIL (SPECIMEN 1 OF 1, COLLECTED ON 08/24/14): HIGHLY ATYPICAL SQUAMOUS CELLS. SEE COMMENT. COMMENT: A NEOPLASTIC PROCESS, INCLUDING SQUAMOUS CELL CARCINOMA, CAN NOT BE RULED OUT. TISSUE STUDIES, SUCH AS A BIOPSY, MAY HELP BETTER EVALUATE THE EXTENT AND SEVERITY OF THESE ATYPICAL CELLS. DR. Herbie Baltimore HILLARD HAS REVIEWED THE CASE AND CONCURS WITH THIS INTERPRETATION.  Nutrition Status Yes No Comments  Weight changes? []  [x]    Swallowing concerns? []  [x]    PEG? []  [x]     Referrals Yes No Comments  Social Work? []  []    Dentistry? []  []    Swallowing therapy? []  []    Nutrition? []  []    Med/Onc? []  [x]     Safety Issues Yes No Comments  Prior radiation? []  [x]  Reports radiation to his prostate 25 yrs ago  Pacemaker/ICD? []  [x]    Possible current pregnancy? []  [x]    Is the patient on methotrexate? []  [x]     Tobacco/Marijuana/Snuff/ETOH use: Non smoker, no smokeless  tobacco used , no illicit drugs, drinks alcohol  Past/Anticipated interventions by otolaryngology, if any:   Past/Anticipated interventions by medical oncology, if any: no  Current Complaints / other details:  Chronic Atril fib  Seen Dr. Chauncey Reading  09/22/14, Short term memory loss, hx Prostate cancer, ambulate with use of walker, hearing loss Peripheral edema, spine surgery x4, skin cancer nose removed 15 years ago, pulmonary congestion, uses oxygen at night- 2 L via nasal cannula-"helps him sleep better, reports having sleep apnea."  Reports his vision isn't "sharp."  Went to the eye doctor approximately one month ago, no visual changes.   Allergies:NKA  BP 126/65 mmHg  Pulse 104  Temp(Src) 97.8 F (36.6 C) (Oral)   Resp 12  Wt   SpO2 98%

## 2014-09-28 ENCOUNTER — Encounter: Payer: Self-pay | Admitting: *Deleted

## 2014-09-28 ENCOUNTER — Ambulatory Visit
Admission: RE | Admit: 2014-09-28 | Discharge: 2014-09-28 | Disposition: A | Discharge status: Home / Self Care | Payer: Medicare Other | Source: Ambulatory Visit | Attending: Radiation Oncology | Admitting: Radiation Oncology

## 2014-09-28 ENCOUNTER — Encounter: Payer: Self-pay | Admitting: Radiation Oncology

## 2014-09-28 DIAGNOSIS — Z8546 Personal history of malignant neoplasm of prostate: Secondary | ICD-10-CM | POA: Diagnosis not present

## 2014-09-28 DIAGNOSIS — C801 Malignant (primary) neoplasm, unspecified: Secondary | ICD-10-CM | POA: Diagnosis not present

## 2014-09-28 DIAGNOSIS — Z510 Encounter for antineoplastic radiation therapy: Secondary | ICD-10-CM | POA: Insufficient documentation

## 2014-09-28 DIAGNOSIS — I4891 Unspecified atrial fibrillation: Secondary | ICD-10-CM | POA: Diagnosis not present

## 2014-09-28 DIAGNOSIS — K118 Other diseases of salivary glands: Secondary | ICD-10-CM

## 2014-09-28 DIAGNOSIS — C7989 Secondary malignant neoplasm of other specified sites: Secondary | ICD-10-CM | POA: Diagnosis not present

## 2014-09-28 HISTORY — DX: Unspecified hearing loss, unspecified ear: H91.90

## 2014-09-28 HISTORY — DX: Unspecified malignant neoplasm of skin, unspecified: C44.90

## 2014-09-28 NOTE — Progress Notes (Signed)
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 Secondary squamous cell carcinoma of parotid gland 198.89 C79.89      HISTORY OF PRESENT ILLNESS::Dustin Bean is a 81 y.o. male who presented with tenderness and swelling in the posterior right parotid region for 3-4 months. Dr.Bates on 08/25/14 biopsied the right parotid mass which revealed highly atypical squamous cells. CT of neck with contrast 09/05/14 showed 2.7 cm mass in the inferior aspect of the right parotid gland. PET scan on 09/15/14 showed the right parotid mass to be hypermetabolic and 3.1 cm in axial dimension. There was also a hypermetabolic level 2 cervical node which was 13 mm on axial scan. No other areas indicative of metastatic disease.   Current Complaints / other details:  Chronic Atril fib  Seen Dr. Chauncey Reading  09/22/14, Short term memory loss, hx Prostate cancer, ambulate with use of walker, hearing loss Peripheral edema, spine surgery x4, skin cancer nose removed 15 years ago, pulmonary congestion, uses oxygen at night- 2 L via nasal cannula-"helps him sleep better, reports having sleep apnea."  Reports his vision isn't "sharp."  Went to the eye doctor approximately one month ago, no visual changes.   Patient notes he currently feels well and good spirits.  States history of skin cancer on nose (central) treated with Mohs surgery in the past Patient notes pain along right neck especially when sleeping on his right side.  Denies facial pain, facial weakness/numbness, unintentional weight loss. Patient lives in a senior community. [facility] for Mohs surgery. Admits appetite  is good and weight is stable.  Denies heart attack, stroke.    PREVIOUS RADIATION THERAPY: No  PAST MEDICAL HISTORY:  has a past medical history of Atrial fibrillation; Hearing loss; Skin cancer (05/17/14); and Cancer (08/25/14).    PAST SURGICAL HISTORY: Past Surgical History  Procedure Laterality Date  . Spine surgery      x 4    FAMILY HISTORY: family history includes Diabetes in his brother; Kidney failure in his father.  SOCIAL HISTORY:  reports that he has never smoked. He has never used smokeless tobacco. He reports that he drinks alcohol. He reports that he does not use illicit drugs.  ALLERGIES: Review of patient's allergies indicates no known allergies.  MEDICATIONS:  Current Outpatient Prescriptions  Medication Sig Dispense Refill  . Ascorbic Acid (VITAMIN C) 1000 MG tablet Take 1,000 mg by mouth daily.    Marland Kitchen atenolol (TENORMIN) 50 MG tablet Take 50 mg by mouth daily.    . cholecalciferol (VITAMIN D) 1000 UNITS tablet Take 1,000 Units by mouth daily.     . Cyanocobalamin (VITAMIN B-12) 2000 MCG TBCR Take 1 tablet by mouth.     . fexofenadine (ALLEGRA) 180 MG tablet Take 180 mg by mouth daily.    . furosemide (LASIX) 20 MG tablet Take 20 mg by mouth daily.    Marland Kitchen lovastatin (MEVACOR) 40 MG tablet Take 40 mg by mouth at bedtime.    . Omega-3 Fatty Acids (FISH OIL) 1000 MG CAPS Take 1 capsule by mouth daily.    Marland Kitchen warfarin (COUMADIN) 1 MG tablet Take 6-6.5 tablets (6-6.5 mg total) by mouth daily.  Takes with 5mg  to get a total of 6mg  or 6.5mg   Takes 6mg  every other day, then takes 6.5mg  every other day 30 tablet 1  . warfarin (COUMADIN) 5 MG tablet Take 1 tablet (5 mg total) by mouth daily. 40 tablet 3   No current facility-administered medications for this encounter.    REVIEW OF SYSTEMS:  Notable for that above.    PHYSICAL EXAM:  oral temperature is 97.8 F (36.6 C). His blood pressure is 126/65 and his pulse is 104. His respiration is 12 and oxygen saturation is 98%.     General: Alert and oriented, in no acute distress HEENT: Head is normocephalic. Extraocular movements are intact. Mucous membranes are moist. Missing maxillary molars on right and right mandibular molars have metal work Neck: Neck is notable for tender nodule at angle of right mandible which is about 2 cm in greatest dimension and seems to curve along the angle of the mandible. Not able to appreciate any supraclavicular or cervical masses.  Heart: Regular in rate and irregular rhythm  Chest: Clear to auscultation bilaterally, with no rhonchi, wheezes, or rales.  Abdomen: Soft, nontender, nondistended, with no rigidity or guarding. Extremities: Mild edema bilaterally  Lymphatics: see Neck Exam Skin: No concerning lesions. Chronic sun exposure but no obvious cancer over right scalp/face.  Surgical scarring - nose. Musculoskeletal: symmetric strength and muscle tone throughout. Neurologic: Cranial nerves II through XII are grossly intact. No obvious focalities. Speech is fluent. Coordination is intact. Psychiatric: Judgment and insight are intact. Affect is appropriate.   ECOG = 2  0 - Asymptomatic (Fully active, able to carry on all predisease activities without restriction)  1 - Symptomatic but completely ambulatory (Restricted in physically strenuous activity but ambulatory and able to carry out work of a light or sedentary nature. For example, light housework, office work)  2 - Symptomatic, <50% in bed during the day (Ambulatory and capable of all self care but unable to carry out any work activities. Up and about more than 50% of waking hours)  3 - Symptomatic, >50% in bed, but not bedbound (Capable of only limited self-care, confined to bed or chair 50% or more of waking hours)  4 - Bedbound (Completely disabled. Cannot carry on any self-care. Totally confined to bed or chair)  5 - Death   Eustace Pen MM, Creech RH, Tormey DC, et al. [phone]). "Toxicity and response criteria of the [facility]". [place] Oncol. 5 (6): 649-55   LABORATORY DATA:  Lab Results  Component Value Date   WBC 5.4 09/08/2014   HGB 13.8 09/08/2014   HCT 40.9 09/08/2014   MCV 99.3 09/08/2014   PLT 147* 09/08/2014   CMP     Component Value Date/Time   NA 140 09/05/2014 0940   K 4.3 09/05/2014 0940   CL 107 09/05/2014 0940   CO2 29 09/05/2014 0940   GLUCOSE 101* 09/05/2014 0940   BUN 16 09/05/2014 0940   CREATININE 1.15 09/05/2014 0940   CALCIUM 8.9 09/05/2014 0940   PROT 6.6 01/28/2014 1402   ALBUMIN 3.5 01/28/2014 1402   AST 20 01/28/2014 1402   ALT 16 01/28/2014 1402   ALKPHOS 54 01/28/2014 1402   BILITOT 0.9 01/28/2014 1402   GFRNONAA 51* 09/05/2014 0940   GFRAA 59* 09/05/2014 0940         RADIOGRAPHY: Ct Head Wo Contrast  09/08/2014   CLINICAL DATA:  Altered mental status. Hypoxia. Atrial fibrillation.  EXAM: CT HEAD WITHOUT CONTRAST  TECHNIQUE: Contiguous axial images were obtained from the base of the skull through the vertex without intravenous contrast.  COMPARISON:  02/25/2005  FINDINGS: There is no intracranial hemorrhage, mass or evidence of acute infarction. There is moderately severe generalized atrophy. There is white matter hypodensity consistent with chronic small vessel ischemic disease. The atrophy and small vessel disease have worsened from 02/25/2005. No acute findings are evident. No bone abnormalities evident. Visible paranasal sinuses are clear.  IMPRESSION: Severe atrophy and moderate chronic small vessel ischemic disease. No acute findings.   Electronically Signed   By: Andreas Newport M.D.   On: 09/08/2014 06:15   Ct Soft Tissue Neck W Contrast  09/05/2014   CLINICAL DATA:  Swelling behind the right ear, 2 months duration. Recent aspiration with atypical cells.  EXAM: CT NECK WITH CONTRAST  TECHNIQUE: Multidetector CT imaging of the neck was performed using the standard protocol following the bolus administration of intravenous contrast.   CONTRAST:  173mL OMNIPAQUE IOHEXOL 300 MG/ML  SOLN  COMPARISON:  09/30/2006  FINDINGS: Pharynx and larynx: Normal  Salivary glands: Left parotid gland normal. Both submandibular glands normal. Mass within the inferior aspect of the superficial lobe of the parotid on the right with central low density. Maximal diameter 2.7 cm. Most consistent with primary parotid neoplasm. Inflamed lymph node less likely.  Thyroid: Normal  Lymph nodes: Slightly prominent level 4 lymph nodes on the right, the largest measuring 8 mm in diameter.  Vascular: Arterial and venous structures are patent. Non stenotic atherosclerotic disease at the carotid bifurcations.  Limited intracranial: Age related atrophy without focal finding.  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 Slightly prominent level 4 lymph nodes on the right, the largest measuring 8 mm.   Electronically Signed   By: Nelson Chimes M.D.   On: 09/05/2014 12:36   Nm Pet Image Initial (pi) Skull Base To Thigh  09/15/2014   CLINICAL DATA:  Initial treatment strategy for parotid mass. Right facial pain and swelling.  EXAM: NUCLEAR MEDICINE PET SKULL BASE TO THIGH  TECHNIQUE: 9.32 mCi F-18 FDG was injected intravenously. Full-ring PET imaging was performed from the skull base to thigh after the radiotracer. CT data was obtained and used for attenuation correction and anatomic localization.  FASTING BLOOD GLUCOSE:  Value: 84 mg/dl  COMPARISON:  Neck CT 09/05/2014.  Bone scan 11/12/2006.  FINDINGS: NECK  The previously demonstrated centrally necrotic right parotid mass is hypermetabolic. This lesion measures 3.1 x 2.1 cm on image 28 and has an SUV max of 19.8. There is an adjacent level 2 right cervical  lymph node adjacent to the angle of the mandible measuring 10 x 13 mm on image 34. This lesion is also hypermetabolic within SUV max of 16.8. No other hypermetabolic cervical lymph nodes demonstrated.There are no lesions of the pharyngeal mucosal space. Intracranial atrophy and carotid atherosclerosis noted.  CHEST  There are no hypermetabolic mediastinal, hilar or axillary lymph nodes. There is no abnormal pulmonary metabolic activity. Small right-greater-than-left pleural effusions are present. There is diffuse atherosclerosis of the aorta, great vessels and coronary arteries. There is chronic lung disease with subpleural reticulation and scattered scarring. There is a calcified right upper lobe granuloma. No suspicious pulmonary nodule.  ABDOMEN/PELVIS  There is  no hypermetabolic activity within the liver, adrenal glands, spleen or pancreas. There is no hypermetabolic nodal activity. There are bilateral renal cysts without associated hypermetabolic activity. At least 1 of these is hyperdense. There is also a cyst in the left hepatic lobe. Diffuse atherosclerosis of the aorta and its branches noted. There is distal colonic diverticulosis and a small left inguinal hernia containing a loop of small bowel. No evidence of incarceration. Urine contamination noted in the perineal region.  SKELETON  There is no hypermetabolic activity to suggest osseous metastatic disease. Moderate convex right thoracolumbar scoliosis with associated spondylosis.  IMPRESSION: 1. Previously demonstrated right parotid lesion is hypermetabolic. Again, this may reflect a primary parotid neoplasm or lymph node. Tissue sampling recommended. There is an adjacent hypermetabolic lymph node in the right level 2 station. 2. No other hypermetabolic nodal activity in the neck, chest, abdomen or pelvis. 3. Incidental findings including diffuse atherosclerosis, chronic lung disease, small pleural effusions and diffuse diverticulosis.   Electronically  Signed   By: Richardean Sale M.D.   On: 09/15/2014 15:01   Dg Chest Port 1 View  09/08/2014   CLINICAL DATA:  Hypoxia, hypertension, weakness  EXAM: PORTABLE CHEST - 1 VIEW  COMPARISON:  02/01/2014  FINDINGS: There is mild unchanged right hemidiaphragm elevation. Heart size is unchanged. There is generalized interstitial prominence which is new. This may represent a mild degree of congestive heart failure. There is no alveolar edema. There is no large effusion.  IMPRESSION: Mild interstitial prominence, likely a minor degree of congestive heart failure.   Electronically Signed   By: Andreas Newport M.D.   On: 09/08/2014 06:17      IMPRESSION/PLAN:  This is a delightful patient with head and neck cancer - presumed skin primary, metastatic to right parotid/neck.  Patient and family are concerned about risks of surgery, thought they understand this would give him the best chance of cure if used at the primary modality for his treatment.   We discussed standard treatment would be surgery plus or minus radiation. We also discussed observation alone if he doesn't want to sustain risk of side effects or complications from treatment. If he declines surgery, I  recommend radiotherapy of 16 treatments over 3.5 weeks for this patient. He is not an ideal candidate for a longer course of therapy (ie 7 weeks). I would try to keep his RT fields rather small to minimize risks of complications in this 81 year old.  Treatments would be palliative, with goal of locoregional control for a durable period.  We discussed the potential risks, benefits, and side effects of radiotherapy and the important balance in disease control and palliation with minimization of side effects from therapy.We talked in detail about acute and late effects. We discussed that some of the most bothersome acute effects may be mucositis, dysgeusia, salivary changes, skin irritation, hair loss, dehydration, weight loss and fatigue. We talked about  late effects which include but are not necessarily limited to dysphagia, hypothyroidism, nerve injury, spinal cord injury, xerostomia, trismus, and neck edema. No guarantees of treatment were given. A consent form was signed and placed in the patient's medical record.    Advised patient to f/u with dermatology to rule out other primary skin cancers.   No simulation or referrals will be made at this time. Patient and family members will discuss all treatment options and will follow up with me on their final decision.   __________________________________________   Eppie Gibson, MD   This document serves  as a record of services personally performed by Eppie Gibson, MD. It was created on her behalf by Derek Mound, a trained medical scribe. The creation of this record is based on the scribe's personal observations and the provider's statements to them. This document has been checked and approved by the attending provider.

## 2014-09-29 ENCOUNTER — Telehealth: Payer: Self-pay | Admitting: *Deleted

## 2014-09-29 NOTE — Progress Notes (Signed)
  Oncology Nurse Navigator Documentation   Navigator Encounter Type: Initial RadOnc (09/28/14 1510)      Met with patient during initial consult with Dr. Isidore Moos.  He was accompanied by his grandson [employer]. 1. Further introduced myself as his Navigator, explained my role as a member of the Care Team.   2. Provided New Patient Information packet, discussed contents:  Contact information for physician(s), myself, other members of the Care Team.  Advance Directive information ([place] blue pamphlet with LCSW contact info).  He stated he has ADs, will bring copies.  Fall Prevention Patient Safety Plan  Appointment Guideline  ACS Referral form  [place] with highlight of [place] 3. Provided introductory explanation of radiation treatment including SIM planning and fitting of head mask, showed them example of mask.   Patient wishes to have further discussion over the weekend about proposed tmt with other family members who are currently out of town.  He agrees to contact me early next week with decision. I encouraged him/grandson to contact me with questions/concerns.  They verbalized understanding.   Gayleen Orem, RN, BSN, [place] at [place] [phone]

## 2014-09-29 NOTE — Telephone Encounter (Signed)
  Oncology Nurse Navigator Documentation   Navigator Encounter Type: Telephone (09/29/14 1423)     Returned patient's VM re VM he received, when he returned call he was direct to me.  No EPIC note offering explanation. We reviewed yesterday's appt, he stated he will have family discussion over weekend about pursuing tmt, he will notify my of his decision early next week.   Gayleen Orem, RN, BSN, [place] at [place] [phone]

## 2014-09-30 DIAGNOSIS — C7989 Secondary malignant neoplasm of other specified sites: Secondary | ICD-10-CM | POA: Insufficient documentation

## 2014-10-04 ENCOUNTER — Telehealth: Payer: Self-pay | Admitting: *Deleted

## 2014-10-04 NOTE — Telephone Encounter (Signed)
  Oncology Nurse Navigator Documentation   Navigator Encounter Type: Telephone (10/04/14 0840)       Received call from patient's dtr stating he wants to proceed with tmt.  Per Dr. Pearlie Oyster direction, I will arrange CT SIM.  Told dtr I would call her with appt date/time.   Gayleen Orem, RN, BSN, [place] at [place] [phone]

## 2014-10-04 NOTE — Telephone Encounter (Signed)
  Oncology Nurse Navigator Documentation   Navigator Encounter Type: Telephone (10/04/14 1034)       Called patient's dtr, Pam, informed of 8/3 1500 CT SIM appt.  I explained arrival/check-in procedure for appt. She verbalized understanding.   Gayleen Orem, RN, BSN, [place] at [place] [phone]

## 2014-10-11 ENCOUNTER — Emergency Department (HOSPITAL_COMMUNITY): Payer: Medicare Other

## 2014-10-11 ENCOUNTER — Other Ambulatory Visit: Payer: Self-pay

## 2014-10-11 ENCOUNTER — Encounter (HOSPITAL_COMMUNITY): Payer: Self-pay

## 2014-10-11 ENCOUNTER — Emergency Department (HOSPITAL_COMMUNITY)
Admission: EM | Admit: 2014-10-11 | Discharge: 2014-10-11 | Disposition: A | Discharge status: Home / Self Care | Payer: Medicare Other | Attending: Emergency Medicine | Admitting: Emergency Medicine

## 2014-10-11 DIAGNOSIS — C07 Malignant neoplasm of parotid gland: Secondary | ICD-10-CM | POA: Diagnosis not present

## 2014-10-11 DIAGNOSIS — Z85828 Personal history of other malignant neoplasm of skin: Secondary | ICD-10-CM | POA: Diagnosis not present

## 2014-10-11 DIAGNOSIS — Z79899 Other long term (current) drug therapy: Secondary | ICD-10-CM | POA: Insufficient documentation

## 2014-10-11 DIAGNOSIS — H9190 Unspecified hearing loss, unspecified ear: Secondary | ICD-10-CM | POA: Diagnosis not present

## 2014-10-11 DIAGNOSIS — C7989 Secondary malignant neoplasm of other specified sites: Secondary | ICD-10-CM

## 2014-10-11 DIAGNOSIS — Z7901 Long term (current) use of anticoagulants: Secondary | ICD-10-CM | POA: Diagnosis not present

## 2014-10-11 DIAGNOSIS — I4891 Unspecified atrial fibrillation: Secondary | ICD-10-CM | POA: Diagnosis not present

## 2014-10-11 DIAGNOSIS — I10 Essential (primary) hypertension: Secondary | ICD-10-CM | POA: Diagnosis present

## 2014-10-11 LAB — CBC WITH DIFFERENTIAL/PLATELET
BASOS ABS: 0 10*3/uL (ref 0.0–0.1)
Basophils Relative: 0 % (ref 0–1)
Eosinophils Absolute: 0.1 10*3/uL (ref 0.0–0.7)
Eosinophils Relative: 1 % (ref 0–5)
HEMATOCRIT: 40.1 % (ref 39.0–52.0)
HEMOGLOBIN: 13.1 g/dL (ref 13.0–17.0)
LYMPHS PCT: 16 % (ref 12–46)
Lymphs Abs: 0.9 10*3/uL (ref 0.7–4.0)
MCH: 32.4 pg (ref 26.0–34.0)
MCHC: 32.7 g/dL (ref 30.0–36.0)
MCV: 99.3 fL (ref 78.0–100.0)
Monocytes Absolute: 0.5 10*3/uL (ref 0.1–1.0)
Monocytes Relative: 9 % (ref 3–12)
NEUTROS PCT: 74 % (ref 43–77)
Neutro Abs: 4.3 10*3/uL (ref 1.7–7.7)
Platelets: 106 10*3/uL — ABNORMAL LOW (ref 150–400)
RBC: 4.04 MIL/uL — AB (ref 4.22–5.81)
RDW: 14.2 % (ref 11.5–15.5)
WBC: 5.8 10*3/uL (ref 4.0–10.5)

## 2014-10-11 LAB — I-STAT CHEM 8, ED
BUN: 21 mg/dL — AB (ref 6–20)
CREATININE: 1.2 mg/dL (ref 0.61–1.24)
Calcium, Ion: 1.23 mmol/L (ref 1.13–1.30)
Chloride: 103 mmol/L (ref 101–111)
Glucose, Bld: 92 mg/dL (ref 65–99)
HCT: 41 % (ref 39.0–52.0)
Hemoglobin: 13.9 g/dL (ref 13.0–17.0)
POTASSIUM: 4.4 mmol/L (ref 3.5–5.1)
SODIUM: 140 mmol/L (ref 135–145)
TCO2: 26 mmol/L (ref 0–100)

## 2014-10-11 LAB — PROTIME-INR
INR: 1.61 — ABNORMAL HIGH (ref 0.00–1.49)
Prothrombin Time: 19.2 seconds — ABNORMAL HIGH (ref 11.6–15.2)

## 2014-10-11 MED ORDER — TRAMADOL HCL 50 MG PO TABS
50.0000 mg | ORAL_TABLET | Freq: Once | ORAL | Status: AC
  Administered 2014-10-11: 50 mg via ORAL
  Filled 2014-10-11: qty 1

## 2014-10-11 MED ORDER — IOHEXOL 300 MG/ML  SOLN
100.0000 mL | Freq: Once | INTRAMUSCULAR | Status: AC | PRN
  Administered 2014-10-11: 75 mL via INTRAVENOUS

## 2014-10-11 MED ORDER — SODIUM CHLORIDE 0.9 % IV BOLUS (SEPSIS)
500.0000 mL | Freq: Once | INTRAVENOUS | Status: AC
  Administered 2014-10-11: 500 mL via INTRAVENOUS

## 2014-10-11 MED ORDER — TRAMADOL HCL 50 MG PO TABS: 25.0000 mg | ORAL_TABLET | Freq: Four times a day (QID) | ORAL | Status: AC | PRN

## 2014-10-11 NOTE — ED Notes (Signed)
Patient transported to CT 

## 2014-10-11 NOTE — ED Notes (Addendum)
Placed pt on 3L oxygen via nasal cannula due to oxygen saturation 81-84% on room air.  O2 now 94%. Also gave two warm blankets per pt request.  No other needs expressed and no acute distress noted.

## 2014-10-11 NOTE — Progress Notes (Signed)
Head and Neck Cancer Simulation, IMRT treatment planning  note   Outpatient  Diagnosis: No diagnosis found.  The patient was taken to the CT simulator and laid in the supine position on the table. An Aquaplast head and shoulder mask was custom fitted to the patient's anatomy. High-resolution CT axial imaging was obtained of the head and neck with contrast. I verified that the quality of the imaging is good for treatment planning. 1 Medically Necessary Treatment Device was fabricated and supervised by me: Aquaplast mask.   Treatment planning note I plan to treat the patient with IMRT. I plan to treat the patient's tumor and upper ipsilateral neck nodes. I plan to treat to a total dose of 48 Gray in 16  fractions. Dose calculation was ordered from dosimetry.  IMRT planning Note  IMRT is an important modality to deliver adequate dose to the patient's at risk tissues while sparing the patient's normal structures, including the: esophagus, parotid tissue, mandible, brain stem, spinal cord, oral cavity, cochlea.  This justifies the use of IMRT in the patient's treatment.     -----------------------------------  Eppie Gibson, MD

## 2014-10-11 NOTE — ED Provider Notes (Signed)
CSN: [id number]     Arrival date & time 10/11/14  1440 History   First MD Initiated Contact with Patient 10/11/14 1522     Chief Complaint  Patient presents with  . Hypertension     (Consider location/radiation/quality/duration/timing/severity/associated sxs/prior Treatment) Patient is a 81 y.o. male presenting with general illness. The history is provided by the patient.  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  Deny fever, chills, vomiting.  Pain worse with chewing, pain difficult right after meals.  Patient on tramadol with good control. With pain episode checked blood pressure and noted to be elevated.  Patient without pain on my exam.   Past Medical History  Diagnosis Date  . Atrial fibrillation   . Hearing loss   . Skin cancer 05/17/14    LEFT INFERIOR CENTRAL ANTERIOR SCALP,SQUAMOUS CELL CA IN SITU,BASE INVOLVED  . Cancer 08/25/14    PAROTID GLAND,RIGHT TAIL,NEOPLASTIC PROCESS,INCLUDING SQUAMOUS CELL CA  CANT BE R/O    Past Surgical History  Procedure Laterality Date  . Spine surgery      x 4   Family History  Problem Relation Age of Onset  . Kidney failure Father   . Diabetes Brother    History  Substance Use Topics  . Smoking status: Never Smoker   . Smokeless tobacco: Never Used  . Alcohol Use: Yes     Comment: rarely    Review of Systems  Constitutional: Positive for fatigue. Negative for fever and chills.  HENT: Positive for facial swelling. Negative for congestion.   Eyes: Negative for discharge and visual disturbance.   Respiratory: Negative for shortness of breath.   Cardiovascular: Negative for chest pain and palpitations.  Gastrointestinal: Negative for vomiting, abdominal pain and diarrhea.  Musculoskeletal: Negative for myalgias and arthralgias.  Skin: Negative for color change and rash.  Neurological: Positive for headaches. Negative for tremors and syncope.  Psychiatric/Behavioral: Negative for confusion and dysphoric mood.      Allergies  Review of patient's allergies indicates no known allergies.  Home Medications   Prior to Admission medications   Medication Sig Start Date End Date Taking? Authorizing Provider  Ascorbic Acid (VITAMIN C) 1000 MG tablet Take 1,000 mg by mouth daily.   Yes Historical Provider, MD  atenolol (TENORMIN) 50 MG tablet Take 50 mg by mouth daily.   Yes Historical Provider, MD  Calcium-Magnesium-Vitamin D 400-166.7-133.3 MG-MG-UNIT TABS Take 1 tablet by mouth daily.   Yes Historical Provider, MD  cholecalciferol (VITAMIN D) 1000 UNITS tablet Take 1,000 Units by mouth daily.    Yes Historical Provider, MD  Cyanocobalamin (VITAMIN B-12) 2000 MCG TBCR Take 1 tablet by mouth.  07/30/10  Yes Historical Provider, MD  fexofenadine (ALLEGRA) 180 MG tablet Take 180 mg by mouth daily.   Yes Historical Provider, MD  furosemide (LASIX) 20 MG tablet Take 20 mg by mouth daily.   Yes Historical Provider, MD  lovastatin (MEVACOR) 40 MG tablet Take 40 mg by mouth at bedtime.   Yes Historical Provider, MD  Omega-3 Fatty Acids (FISH OIL) 1000 MG CAPS Take 1 capsule by mouth  daily.   Yes Historical Provider, MD  potassium chloride SA (K-DUR,KLOR-CON) 20 MEQ tablet Take 20 mEq by mouth daily.   Yes Historical Provider, MD  tamsulosin (FLOMAX) 0.4 MG CAPS capsule Take 0.4 mg by mouth daily.   Yes Historical Provider, MD  traMADol (ULTRAM) 50 MG tablet Take 25-50 mg by mouth every 6 (six) hours as needed for moderate pain.   Yes Historical Provider, MD  warfarin (COUMADIN) 5 MG tablet Take 1  tablet (5 mg total) by mouth daily. 08/12/14  Yes Darlin Coco, MD  traMADol (ULTRAM) 50 MG tablet Take 0.5 tablets (25 mg total) by mouth every 6 (six) hours as needed for severe pain. 10/11/14   Deno Etienne, DO  warfarin (COUMADIN) 1 MG tablet Take 6-6.5 tablets (6-6.5 mg total) by mouth daily. Takes with 5mg  to get a total of 6mg  or 6.5mg   Takes 6mg  every other day, then takes 6.5mg  every other day Patient not taking: Reported on 10/11/2014 08/12/14   Darlin Coco, MD   BP 172/109 mmHg  Pulse 72  Temp(Src) 97.9 F (36.6 C) (Oral)  Resp 18  Ht 6\' 1"  (1.854 m)  SpO2 91% Physical Exam  Constitutional: He is oriented to person, place, and time. He appears well-developed and well-nourished.  HENT:  Head: Normocephalic and atraumatic.  Submandibular swelling, TTP, no noted fluctuance or erythema, no intraoral extension, not drooling  Eyes: EOM are normal. Pupils are equal, round, and reactive to light.  Neck: Normal range of motion. Neck supple. No JVD present.  Cardiovascular: Normal rate and regular rhythm.  Exam reveals no gallop and no friction rub.   No murmur heard. Pulmonary/Chest: No respiratory distress. He has no wheezes.  Abdominal: He exhibits no distension. There is no rebound and no guarding.  Musculoskeletal: Normal range of motion.  Neurological: He is alert and oriented to person, place, and time.  Skin: No rash noted. No pallor.  Psychiatric: He has a normal mood and affect. His behavior is normal.    ED Course  Procedures (including critical care time) Labs Review Labs Reviewed  CBC WITH DIFFERENTIAL/PLATELET - Abnormal; Notable for the following:    RBC 4.04 (*)    Platelets 106 (*)    All other components within normal limits  PROTIME-INR - Abnormal; Notable for the following:    Prothrombin Time 19.2 (*)    INR 1.61 (*)    All other components within normal limits  I-STAT CHEM 8, ED - Abnormal; Notable for the following:    BUN 21 (*)    All other  components within normal limits    Imaging Review Dg Chest 2 View  10/11/2014   CLINICAL DATA:  Patient with sudden onset chest pain radiating to the right shoulder.  EXAM: CHEST  2 VIEW  COMPARISON:  Chest radiograph 09/08/2014  FINDINGS: Multiple monitoring leads overlie the patient. Patient is mildly rotated. Stable enlarged cardiac and mediastinal contours. No significant interval change diffuse bilateral coarse interstitial opacities. Biapical pleural parenchymal thickening. Small bilateral pleural effusions. Multi level degenerative changes of thoracic spine.  IMPRESSION: Cardiomegaly.  Interstitial prominence bilaterally which may represent mild interstitial edema.  Small bilateral pleural effusions.   Electronically Signed   By: Lovey Newcomer M.D.   On: 10/11/2014 18:16   Ct Soft Tissue Neck W Contrast  10/11/2014   CLINICAL DATA:  Increasing right facial pain. Patient has a parotid gland cancer.  EXAM: CT NECK WITH CONTRAST  TECHNIQUE: Multidetector CT imaging of the neck was  performed using the standard protocol following the bolus administration of intravenous contrast.  CONTRAST:  74mL OMNIPAQUE IOHEXOL 300 MG/ML  SOLN  COMPARISON:  PET-CT dated 09/15/2014  FINDINGS: Pharynx and Larynx: No evidence of mass.  Salivary Glands: The previously demonstrated right parotid mass demonstrates rim enhancement and hypoattenuated Center, usually associated with necrosis. It measures 3.0 by 2.4 by 2.4 cm. There are 2 adjacent right submandibular level 2 also necrotic-appearing masses, which may represent involved lymph nodes, measuring 0.9 and and 1.3 cm in long-axis.  Thyroid:  No concerning nodule.  Lymph nodes: No left-sided enlarged or necrotic appearing lymph nodes.  Skeleton: No acute or aggressive findings. Multilevel degenerative changes of the cervical spine are noted.  Upper Chest: There are prominent interstitial markings within the visualized portions of the upper lungs, which may be seen with  interstitial pulmonary edema. Atherosclerotic disease of the aorta is seen.  IMPRESSION: Relatively stable in size but necrotic appearing right parotid mass, with 2 associated abnormal necrotic appearing right cervical level 2 lymph nodes.  Increased interstitial markings in the visualized portions of the lungs, which may be seen with interstitial pulmonary edema.  Atherosclerotic disease of the aorta.   Electronically Signed   By: Fidela Salisbury M.D.   On: 10/11/2014 17:33     EKG Interpretation None        MDM   Final diagnoses:  Secondary squamous cell carcinoma of parotid gland    81 yo M with a chief complaint of facial pain and HTN.  BP 148/92 on arrival, no chest pain, focal weakness, headache.  Feel HTN likely secondary to pain, will ct scan to eval for rapid growth and worsening of pain to rule out abscess.  While patient was in the ED had sudden onset of right shoulder pain. This pain is localized to the right trapezius muscle worse with palpation. Patient was given 1 tramadol with complete relief of his pain. An EKG was obtained that was unremarkable chest x-ray showed no masses or focal findings for the pain. Blood pressure improved pain control. Will have the patient have this rechecked at his PCPs office.  CT scan with necrotic areas. Spoke with ENT on call felt that this was normal progression of the cancer. Recommended soft diet pain control follow-up for radiation.    Deno Etienne, DO 10/11/14 2305

## 2014-10-11 NOTE — ED Notes (Addendum)
Per pt and dtr, Pt at home was elevated 195/115.  No bp meds.  Pulse 100.  Pt has not been feeling well.  Increase pain in rt face.  Brought in for this.  Pt also has salivary gland cancer and has appt tomorrow for mask fitting for future radiation treatments.  Denies n/v.  No fever

## 2014-10-12 ENCOUNTER — Emergency Department (HOSPITAL_COMMUNITY)
Admission: EM | Admit: 2014-10-12 | Discharge: 2014-10-12 | Disposition: A | Discharge status: Home / Self Care | Payer: Medicare Other | Attending: Emergency Medicine | Admitting: Emergency Medicine

## 2014-10-12 ENCOUNTER — Ambulatory Visit
Admit: 2014-10-12 | Discharge: 2014-10-12 | Disposition: A | Discharge status: Home / Self Care | Payer: Medicare Other | Attending: Radiation Oncology | Admitting: Radiation Oncology

## 2014-10-12 ENCOUNTER — Ambulatory Visit: Payer: Medicare Other | Admitting: Radiation Oncology

## 2014-10-12 ENCOUNTER — Ambulatory Visit (INDEPENDENT_AMBULATORY_CARE_PROVIDER_SITE_OTHER): Payer: Medicare Other | Admitting: Pharmacist

## 2014-10-12 ENCOUNTER — Ambulatory Visit: Admit: 2014-10-12 | Payer: Self-pay | Admitting: Otolaryngology

## 2014-10-12 ENCOUNTER — Encounter: Payer: Self-pay | Admitting: Pharmacist

## 2014-10-12 ENCOUNTER — Emergency Department (HOSPITAL_COMMUNITY): Payer: Medicare Other

## 2014-10-12 ENCOUNTER — Encounter (HOSPITAL_COMMUNITY): Payer: Self-pay | Admitting: *Deleted

## 2014-10-12 ENCOUNTER — Ambulatory Visit
Admission: RE | Admit: 2014-10-12 | Discharge: 2014-10-12 | Disposition: A | Discharge status: Home / Self Care | Payer: Medicare Other | Source: Ambulatory Visit | Attending: Radiation Oncology | Admitting: Radiation Oncology

## 2014-10-12 VITALS — BP 116/68

## 2014-10-12 DIAGNOSIS — Z7901 Long term (current) use of anticoagulants: Secondary | ICD-10-CM | POA: Insufficient documentation

## 2014-10-12 DIAGNOSIS — Z5181 Encounter for therapeutic drug level monitoring: Secondary | ICD-10-CM

## 2014-10-12 DIAGNOSIS — Z85828 Personal history of other malignant neoplasm of skin: Secondary | ICD-10-CM | POA: Diagnosis not present

## 2014-10-12 DIAGNOSIS — R410 Disorientation, unspecified: Secondary | ICD-10-CM | POA: Diagnosis not present

## 2014-10-12 DIAGNOSIS — M47812 Spondylosis without myelopathy or radiculopathy, cervical region: Secondary | ICD-10-CM

## 2014-10-12 DIAGNOSIS — R52 Pain, unspecified: Secondary | ICD-10-CM

## 2014-10-12 DIAGNOSIS — H9190 Unspecified hearing loss, unspecified ear: Secondary | ICD-10-CM | POA: Diagnosis not present

## 2014-10-12 DIAGNOSIS — Z79899 Other long term (current) drug therapy: Secondary | ICD-10-CM | POA: Insufficient documentation

## 2014-10-12 DIAGNOSIS — I10 Essential (primary) hypertension: Secondary | ICD-10-CM | POA: Diagnosis not present

## 2014-10-12 DIAGNOSIS — I482 Chronic atrial fibrillation, unspecified: Secondary | ICD-10-CM

## 2014-10-12 DIAGNOSIS — M5030 Other cervical disc degeneration, unspecified cervical region: Secondary | ICD-10-CM | POA: Insufficient documentation

## 2014-10-12 DIAGNOSIS — I159 Secondary hypertension, unspecified: Secondary | ICD-10-CM | POA: Insufficient documentation

## 2014-10-12 DIAGNOSIS — I4891 Unspecified atrial fibrillation: Secondary | ICD-10-CM | POA: Insufficient documentation

## 2014-10-12 DIAGNOSIS — M25512 Pain in left shoulder: Secondary | ICD-10-CM | POA: Diagnosis present

## 2014-10-12 HISTORY — DX: Essential (primary) hypertension: I10

## 2014-10-12 LAB — URINALYSIS, ROUTINE W REFLEX MICROSCOPIC
BILIRUBIN URINE: NEGATIVE
Glucose, UA: NEGATIVE mg/dL
Hgb urine dipstick: NEGATIVE
Ketones, ur: NEGATIVE mg/dL
Leukocytes, UA: NEGATIVE
Nitrite: NEGATIVE
Protein, ur: 30 mg/dL — AB
Specific Gravity, Urine: 1.018 (ref 1.005–1.030)
Urobilinogen, UA: 0.2 mg/dL (ref 0.0–1.0)
pH: 7 (ref 5.0–8.0)

## 2014-10-12 LAB — CBC WITH DIFFERENTIAL/PLATELET
BASOS PCT: 0 % (ref 0–1)
Basophils Absolute: 0 10*3/uL (ref 0.0–0.1)
EOS ABS: 0 10*3/uL (ref 0.0–0.7)
Eosinophils Relative: 0 % (ref 0–5)
HCT: 42.8 % (ref 39.0–52.0)
Hemoglobin: 14.1 g/dL (ref 13.0–17.0)
LYMPHS ABS: 0.8 10*3/uL (ref 0.7–4.0)
Lymphocytes Relative: 11 % — ABNORMAL LOW (ref 12–46)
MCH: 32.8 pg (ref 26.0–34.0)
MCHC: 32.9 g/dL (ref 30.0–36.0)
MCV: 99.5 fL (ref 78.0–100.0)
Monocytes Absolute: 0.4 10*3/uL (ref 0.1–1.0)
Monocytes Relative: 5 % (ref 3–12)
NEUTROS ABS: 5.9 10*3/uL (ref 1.7–7.7)
NEUTROS PCT: 84 % — AB (ref 43–77)
Platelets: 123 10*3/uL — ABNORMAL LOW (ref 150–400)
RBC: 4.3 MIL/uL (ref 4.22–5.81)
RDW: 14.3 % (ref 11.5–15.5)
WBC: 7.1 10*3/uL (ref 4.0–10.5)

## 2014-10-12 LAB — BASIC METABOLIC PANEL
Anion gap: 7 (ref 5–15)
BUN: 15 mg/dL (ref 6–20)
CO2: 26 mmol/L (ref 22–32)
CREATININE: 0.91 mg/dL (ref 0.61–1.24)
Calcium: 8.9 mg/dL (ref 8.9–10.3)
Chloride: 104 mmol/L (ref 101–111)
GFR calc Af Amer: >60 mL/min (ref >60)
GFR calc non Af Amer: >60 mL/min (ref >60)
Glucose, Bld: 126 mg/dL — ABNORMAL HIGH (ref 65–99)
Potassium: 4.5 mmol/L (ref 3.5–5.1)
Sodium: 137 mmol/L (ref 135–145)

## 2014-10-12 LAB — POCT INR: INR: 1.4

## 2014-10-12 LAB — TROPONIN I

## 2014-10-12 LAB — URINE MICROSCOPIC-ADD ON: Urine-Other: NONE SEEN

## 2014-10-12 LAB — I-STAT CG4 LACTIC ACID, ED: LACTIC ACID, VENOUS: 1.03 mmol/L (ref 0.5–2.0)

## 2014-10-12 SURGERY — EXCISION, PAROTID GLAND
Anesthesia: General | Laterality: Right

## 2014-10-12 MED ORDER — PIPERACILLIN-TAZOBACTAM 3.375 G IVPB 30 MIN
3.3750 g | Freq: Once | INTRAVENOUS | Status: AC
  Administered 2014-10-12: 3.375 g via INTRAVENOUS
  Filled 2014-10-12: qty 50

## 2014-10-12 MED ORDER — LORAZEPAM 2 MG/ML IJ SOLN
0.5000 mg | Freq: Once | INTRAMUSCULAR | Status: AC
  Administered 2014-10-12: 0.5 mg via INTRAVENOUS
  Filled 2014-10-12: qty 1

## 2014-10-12 MED ORDER — FENTANYL CITRATE (PF) 100 MCG/2ML IJ SOLN
50.0000 ug | INTRAMUSCULAR | Status: DC | PRN
  Filled 2014-10-12: qty 2

## 2014-10-12 MED ORDER — TRAMADOL HCL 50 MG PO TABS
50.0000 mg | ORAL_TABLET | Freq: Once | ORAL | Status: AC
  Administered 2014-10-12: 50 mg via ORAL
  Filled 2014-10-12: qty 1

## 2014-10-12 MED ORDER — ONDANSETRON HCL 4 MG/2ML IJ SOLN: 4.0000 mg | Freq: Once | INTRAMUSCULAR | Status: DC

## 2014-10-12 MED ORDER — SODIUM CHLORIDE 0.9 % IV SOLN
INTRAVENOUS | Status: DC
  Administered 2014-10-12: 17:00:00 via INTRAVENOUS

## 2014-10-12 NOTE — ED Provider Notes (Addendum)
CSN: [id number]     Arrival date & time 10/12/14  1438 History   First MD Initiated Contact with Patient 10/12/14 1508     Chief Complaint  Patient presents with  . Shoulder Pain    right     (Consider location/radiation/quality/duration/timing/severity/associated sxs/prior Treatment) Patient is a 81 y.o. male presenting with shoulder pain. The history is provided by the patient.  Shoulder Pain Dustin Bean is a 81 y.o. male who presents for evaluation of right shoulder pain. He is vague about one started, but states that his tramadol is not helping it. There is a family member with him states that his blood pressure was high, at home today. I contacted the patient's daughter by phone and she states the blood pressure was 225/113. She is concerned because the patient states that he "feels bad". She is not sure what this means. She is also concerned that for 1 week he has been somewhat confused. She states that he is usually "very sharp". There are no other known modifying factors.    Past Medical History  Diagnosis Date  . Atrial fibrillation   . Hearing loss   . Skin cancer 05/17/14    LEFT INFERIOR CENTRAL ANTERIOR SCALP,SQUAMOUS CELL CA IN SITU,BASE INVOLVED  . Cancer 08/25/14    PAROTID GLAND,RIGHT TAIL,NEOPLASTIC PROCESS,INCLUDING SQUAMOUS CELL CA  CANT BE R/O   . Hypertension    Past Surgical History  Procedure Laterality Date  . Spine surgery      x 4   Family History  Problem Relation Age of Onset  . Kidney failure Father   . Diabetes Brother    History  Substance Use Topics  . Smoking status: Never Smoker   . Smokeless tobacco: Never Used  . Alcohol Use: Yes     Comment: rarely    Review of Systems  All other systems reviewed and are negative.     Allergies  Review of patient's allergies indicates no known allergies.  Home Medications   Prior to Admission medications   Medication Sig Start Date End Date Taking? Authorizing Provider  Ascorbic Acid  (VITAMIN C) 1000 MG tablet Take 1,000 mg by mouth daily.   Yes Historical Provider, MD  atenolol (TENORMIN) 50 MG tablet Take 50 mg by mouth daily.   Yes Historical Provider, MD  Calcium-Magnesium-Vitamin D 400-166.7-133.3 MG-MG-UNIT TABS Take 1 tablet by mouth daily.   Yes Historical Provider, MD  cholecalciferol (VITAMIN D) 1000 UNITS tablet Take 1,000 Units by mouth daily.    Yes Historical Provider, MD  Cyanocobalamin (VITAMIN B-12) 2000 MCG TBCR Take 2,000 mcg by mouth daily.  07/30/10  Yes Historical Provider, MD  fexofenadine (ALLEGRA) 180 MG tablet Take 180 mg by mouth daily.   Yes Historical Provider, MD  furosemide (LASIX) 20 MG tablet Take 20 mg by mouth daily.   Yes Historical Provider, MD  lovastatin (MEVACOR) 40 MG tablet Take 40 mg by mouth at bedtime.   Yes Historical Provider, MD  Omega-3 Fatty Acids (FISH OIL) 1000 MG CAPS Take 1,000 mg by mouth daily.    Yes Historical Provider, MD  potassium chloride SA (K-DUR,KLOR-CON) 20 MEQ tablet Take 20 mEq by mouth daily.   Yes Historical Provider, MD  tamsulosin (FLOMAX) 0.4 MG CAPS capsule Take 0.4 mg by mouth daily.   Yes Historical Provider, MD  traMADol (ULTRAM) 50 MG tablet Take 0.5 tablets (25 mg total) by mouth every 6 (six) hours as needed for severe pain. 10/11/14  Yes Deno Etienne,  DO  warfarin (COUMADIN) 5 MG tablet Take 1 tablet (5 mg total) by mouth daily. 08/12/14  Yes Darlin Coco, MD  warfarin (COUMADIN) 1 MG tablet Take 6-6.5 tablets (6-6.5 mg total) by mouth daily. Takes with 5mg  to get a total of 6mg  or 6.5mg   Takes 6mg  every other day, then takes 6.5mg  every other day Patient not taking: Reported on 10/11/2014 08/12/14   Darlin Coco, MD   Temp(Src) 98 F (36.7 C)  SpO2 96% Physical Exam  Constitutional: He is oriented to person, place, and time. He appears well-developed.  Elderly, frail  HENT:  Head: Normocephalic and atraumatic.  Right Ear: External ear normal.  Left Ear: External ear normal.  Eyes:  Conjunctivae and EOM are normal. Pupils are equal, round, and reactive to light.  Neck: Normal range of motion and phonation normal. Neck supple.  Cardiovascular: Normal rate, regular rhythm and normal heart sounds.   Pulmonary/Chest: Effort normal and breath sounds normal. No respiratory distress. He exhibits no bony tenderness.  Abdominal: Soft. There is no tenderness.  Musculoskeletal: Normal range of motion.  Mild right shoulder pain with active and passive range of motion. No right shoulder deformity.  Neurological: He is alert and oriented to person, place, and time. No cranial nerve deficit or sensory deficit. He exhibits normal muscle tone. Coordination normal.  Skin: Skin is warm, dry and intact.  Psychiatric: He has a normal mood and affect. His behavior is normal.  Nursing note and vitals reviewed.   ED Course  Procedures (including critical care time)  Medications  0.9 %  sodium chloride infusion ( Intravenous New Bag/Given 10/12/14 1706)  piperacillin-tazobactam (ZOSYN) IVPB 3.375 g (3.375 g Intravenous New Bag/Given 10/12/14 1706)  traMADol (ULTRAM) tablet 50 mg (50 mg Oral Given 10/12/14 1706)  LORazepam (ATIVAN) injection 0.5 mg (0.5 mg Intravenous Given 10/12/14 1728)    Patient Vitals for the past 24 hrs:  Temp SpO2  10/12/14 1711 98 F (36.7 C) -  10/12/14 1449 - 96 %    8:13 PM Reevaluation with update and discussion. After initial assessment and treatment, an updated evaluation reveals patient is comfortable now. He is somewhat sleepy as the tramadol. His daughter is now here, and findings were discussed with her. Blood pressure repeated, and is somewhat improved. [place] Review Labs Reviewed  BASIC METABOLIC PANEL - Abnormal; Notable for the following:    Glucose, Bld 126 (*)    All other components within normal limits  CBC WITH DIFFERENTIAL/PLATELET - Abnormal; Notable for the following:    Platelets 123 (*)    Neutrophils Relative % 84 (*)     Lymphocytes Relative 11 (*)    All other components within normal limits  URINALYSIS, ROUTINE W REFLEX MICROSCOPIC (NOT AT [facility]) - Abnormal; Notable for the following:    Protein, ur 30 (*)    All other components within normal limits  URINE CULTURE  CULTURE, BLOOD (ROUTINE X 2)  CULTURE, BLOOD (ROUTINE X 2)  TROPONIN I  URINE MICROSCOPIC-ADD ON  I-STAT CG4 LACTIC ACID, ED    Imaging Review Dg Chest 2 View  10/11/2014   CLINICAL DATA:  Patient with sudden onset chest pain radiating to the right shoulder.  EXAM: CHEST  2 VIEW  COMPARISON:  Chest radiograph 09/08/2014  FINDINGS: Multiple monitoring leads overlie the patient. Patient is mildly rotated. Stable enlarged cardiac and mediastinal contours. No significant interval change diffuse bilateral coarse interstitial opacities. Biapical pleural parenchymal thickening. Small bilateral  pleural effusions. Multi level degenerative changes of thoracic spine.  IMPRESSION: Cardiomegaly.  Interstitial prominence bilaterally which may represent mild interstitial edema.  Small bilateral pleural effusions.   Electronically Signed   By: Lovey Newcomer M.D.   On: 10/11/2014 18:16   Ct Soft Tissue Neck W Contrast  10/11/2014   CLINICAL DATA:  Increasing right facial pain. Patient has a parotid gland cancer.  EXAM: CT NECK WITH CONTRAST  TECHNIQUE: Multidetector CT imaging of the neck was performed using the standard protocol following the bolus administration of intravenous contrast.  CONTRAST:  39mL OMNIPAQUE IOHEXOL 300 MG/ML  SOLN  COMPARISON:  PET-CT dated 09/15/2014  FINDINGS: Pharynx and Larynx: No evidence of mass.  Salivary Glands: The previously demonstrated right parotid mass demonstrates rim enhancement and hypoattenuated Center, usually associated with necrosis. It measures 3.0 by 2.4 by 2.4 cm. There are 2 adjacent right submandibular level 2 also necrotic-appearing masses, which may represent involved lymph nodes, measuring 0.9 and and 1.3 cm in  long-axis.  Thyroid:  No concerning nodule.  Lymph nodes: No left-sided enlarged or necrotic appearing lymph nodes.  Skeleton: No acute or aggressive findings. Multilevel degenerative changes of the cervical spine are noted.  Upper Chest: There are prominent interstitial markings within the visualized portions of the upper lungs, which may be seen with interstitial pulmonary edema. Atherosclerotic disease of the aorta is seen.  IMPRESSION: Relatively stable in size but necrotic appearing right parotid mass, with 2 associated abnormal necrotic appearing right cervical level 2 lymph nodes.  Increased interstitial markings in the visualized portions of the lungs, which may be seen with interstitial pulmonary edema.  Atherosclerotic disease of the aorta.   Electronically Signed   By: Fidela Salisbury M.D.   On: 10/11/2014 17:33      Date: 10/12/14  Rate: 87  Rhythm: atrial fibrillation  QRS Axis: indeterminate  PR and QT Intervals: QT normal  ST/T Wave abnormalities: normal  PR and QRS Conduction Disutrbances:right bundle branch block  Narrative Interpretation:   Old EKG Reviewed: unchanged since 09/22/14    MDM   Final diagnoses:  Confusion  Pain    Nonspecific confusion in elderly male. No evidence for CVA, delirium, serious bacterial infection or metabolic instability. Mild hypertension. Review of prior records reveal recent high and normal blood pressures. At this time. I doubt hypertensive urgency. There is no current indication for starting additional medicines for blood pressure control. So, her pain is likely due to arthritis, and is not likely related to a fracture or radiculopathy.  Nursing Notes Reviewed/ Care Coordinated Applicable Imaging Reviewed Interpretation of Laboratory Data incorporated into ED treatment  The patient appears reasonably screened and/or stabilized for discharge and I doubt any other medical condition or other [facility] requiring further screening, evaluation, or  treatment in the ED at this time prior to discharge.  Plan: Home Medications- usual; Home Treatments- rest; return here if the recommended treatment, does not improve the symptoms; Recommended follow up- PCP prn     Daleen Bo, MD 10/12/14 2332  Daleen Bo, MD 10/31/14 [phone]

## 2014-10-12 NOTE — ED Notes (Signed)
Patient came into ED today  From home d/t recurrent right shoulder pain and hypertension. Pt usually stays at [facility] but has been with his daughter most recently. He denied CP, SOB. Pt was seen yesterday  With same sym,ptoms. Daughter is concerned pt has been slowing down recently.

## 2014-10-12 NOTE — ED Notes (Signed)
Patient transported to MRI 

## 2014-10-12 NOTE — ED Notes (Signed)
Questions r/t dc were denied. Pt is ambulatory with walker. He is a&ox3

## 2014-10-12 NOTE — ED Notes (Signed)
Manual BP taken by this RN and reported to EDP (manual vs automated)

## 2014-10-12 NOTE — ED Notes (Signed)
MD at bedside. 

## 2014-10-12 NOTE — Discharge Instructions (Signed)
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 Try using heat on sore area 3 times a day. Follow up with your Dr. for a blood pressure check in one week.    Confusion Confusion is the inability to think with your usual speed or clarity. Confusion may come on quickly or slowly over time. How quickly the confusion comes on depends on the cause. Confusion can be due to any number of causes. CAUSES   Concussion, head injury, or head trauma.  Seizures.  Stroke.  Fever.  Brain tumor.  Age related decreased brain function (dementia).  Heightened emotional states like rage or terror.  Mental illness in which the person loses the ability to determine what is real and what is not (hallucinations).  Infections such as a urinary tract infection (UTI).  Toxic effects from alcohol, drugs, or prescription medicines.  Dehydration and an imbalance of salts in the body (electrolytes).  Lack of sleep.  Low blood sugar (diabetes).  Low levels of oxygen from conditions such as chronic lung disorders.  Drug interactions or other medicine side effects.  Nutritional deficiencies, especially niacin, thiamine, vitamin C, or vitamin B.  Sudden drop in body temperature (hypothermia).  Change in routine, such as when traveling or hospitalized. SIGNS AND SYMPTOMS  People often describe their thinking as cloudy or unclear when they are confused. Confusion can also include feeling disoriented. That means you are unaware of where or who you are. You may also not know what the date or time is. If confused, you may also have difficulty paying attention, remembering, and making decisions. Some people also act aggressively when they are confused.  DIAGNOSIS  The medical evaluation of confusion may include:  Blood and urine tests.  X-rays.  Brain and nervous system tests.  Analyzing your brain waves (electroencephalogram or EEG).  Magnetic resonance imaging (MRI)  of your head.  Computed tomography (CT) scan of your head.  Mental status tests in which your health care provider may ask many questions. Some of these questions may seem silly or strange, but they are a very important test to help diagnose and treat confusion. TREATMENT  An admission to the hospital may not be needed, but a person with confusion should not be left alone. Stay with a family member or friend until the confusion clears. Avoid alcohol, pain relievers, or sedative drugs until you have fully recovered. Do not drive until directed by your health care provider. HOME CARE INSTRUCTIONS  What family and friends can do:  To find out if someone is confused, ask the person to state his or her name, age, and the date. If the person is unsure or answers incorrectly, he or she is confused.  Always introduce yourself, no matter how well the person knows you.  Often remind the person of his or her location.  Place a calendar and clock near the confused person.  Help the person with his or her medicines. You may want to use a pill box, an alarm as a reminder, or give the person each dose as prescribed.  Talk about current events and plans for the day.  Try to keep the environment calm, quiet, and peaceful.  Make sure the person keeps follow-up visits with his or her health care provider. PREVENTION  Ways to prevent confusion:  Avoid alcohol.  Eat a balanced diet.  Get enough sleep.  Take medicine only as directed by your health care provider.  Do not become  isolated. Spend time with other people and make plans for your days.  Keep careful watch on your blood sugar levels if you are diabetic. SEEK IMMEDIATE MEDICAL CARE IF:   You develop severe headaches, repeated vomiting, seizures, blackouts, or slurred speech.  There is increasing confusion, weakness, numbness, restlessness, or personality changes.  You develop a loss of balance, have marked dizziness, feel  uncoordinated, or fall.  You have delusions, hallucinations, or develop severe anxiety.  Your family members think you need to be rechecked. Document Released: 04/04/2004 Document Revised: 07/12/2013 Document Reviewed: 04/02/2013 [facility] Patient Information 2015 [place], [place]. This information is not intended to replace advice given to you by your health care provider. Make sure you discuss any questions you have with your health care provider.

## 2014-10-12 NOTE — ED Notes (Signed)
Pt in MRI.

## 2014-10-13 ENCOUNTER — Encounter: Payer: Self-pay | Admitting: Medical Oncology

## 2014-10-13 ENCOUNTER — Telehealth: Payer: Self-pay | Admitting: Medical Oncology

## 2014-10-13 NOTE — Telephone Encounter (Signed)
Oncology Nurse Navigator Documentation  Oncology Nurse Navigator Flowsheets 10/04/2014 10/04/2014 10/13/2014  Referral date to RadOnc/MedOnc - - -  Navigator Encounter Type Telephone Telephone Telephone- I called Pam-daughter to inquire about missed CT sim appointment yesterday.She states that she attempted to cancel the appointment without success. She had left a message for Liliane Channel not realizing he was off. She attempted to speak with someone in radiation but was not able. I expressed our concern for her father and informed her the appointment can be rescheduled.  She states that her father was in the ED with pain and confusion. He had MRI to rule out stroke.  She states went to the beach for the weekend and when she returned she was shocked to see how much her father had declined. She feels all his symptoms are related to his cancer and she would like to get him rescheduled ASAP. I will contact CT sim and have them call her directly to reschedule. She thanked me for my concern and appreciated my help. I asked her to call me back with any other questions or concerns. She voiced understanding.  Patient Visit Type - - -  Interventions Coordination of Care Coordination of Care Coordination of Care  Coordination of Care - - Other  Education Method - - -  Time Spent with Patient 15 15 15

## 2014-10-13 NOTE — Progress Notes (Signed)
Oncology Nurse Navigator Documentation  Oncology Nurse Navigator Flowsheets 10/04/2014 10/13/2014 10/13/2014  Referral date to RadOnc/MedOnc - - -  Navigator Encounter Type Telephone Telephone Telephone  Patient Visit Type - - -  Interventions Coordination of Care Coordination of Care Other- CT sim rescheduled for 10/17/14 at 10:00 am per Amy in CT sim.  Coordination of Care - Other -  Education Method - - -  Time Spent with Patient 15 15 -

## 2014-10-14 LAB — URINE CULTURE: Special Requests: NORMAL

## 2014-10-15 NOTE — Progress Notes (Signed)
ED Antimicrobial Stewardship Positive Culture Follow Up   Dustin Bean is an 81 y.o. male who presented to [facility] on 10/12/2014 with a chief complaint of  Chief Complaint  Patient presents with  . Shoulder Pain    right    Recent Results (from the past 720 hour(s))  Culture, blood (routine x 2)     Status: None (Preliminary result)   Collection Time: 10/12/14  4:21 PM  Result Value Ref Range Status   Specimen Description BLOOD LEFT HAND  Final   Special Requests BOTTLES DRAWN AEROBIC AND ANAEROBIC 3CC  Final   Culture   Final    NO GROWTH 2 DAYS Performed at [facility]    Report Status PENDING  Incomplete  Culture, blood (routine x 2)     Status: None (Preliminary result)   Collection Time: 10/12/14  4:24 PM  Result Value Ref Range Status   Specimen Description BLOOD LEFT ANTECUBITAL  Final   Special Requests BOTTLES DRAWN AEROBIC AND ANAEROBIC 4CC  Final   Culture   Final    NO GROWTH 2 DAYS Performed at [facility]    Report Status PENDING  Incomplete  Urine culture     Status: None   Collection Time: 10/12/14  4:37 PM  Result Value Ref Range Status   Specimen Description URINE, CLEAN CATCH  Final   Special Requests Normal  Final   Culture   Final    40,000 COLONIES/ml ESCHERICHIA COLI Performed at [facility]    Report Status 10/14/2014 FINAL  Final   Organism ID, Bacteria ESCHERICHIA COLI  Final      Susceptibility   Escherichia coli - MIC*    AMPICILLIN >=32 RESISTANT Resistant     CEFAZOLIN <=4 SENSITIVE Sensitive     CEFTRIAXONE <=1 SENSITIVE Sensitive     CIPROFLOXACIN <=0.25 SENSITIVE Sensitive     GENTAMICIN <=1 SENSITIVE Sensitive     IMIPENEM <=0.25 SENSITIVE Sensitive     NITROFURANTOIN <=16 SENSITIVE Sensitive     TRIMETH/SULFA <=20 SENSITIVE Sensitive     AMPICILLIN/SULBACTAM 16 INTERMEDIATE Intermediate     PIP/TAZO <=4 SENSITIVE Sensitive     * 40,000 COLONIES/ml ESCHERICHIA COLI    UA negative, Urine Micro  negative, no urinary complaints.  This represents asymptomatic bacteriuria and no treatment is required.   Norva Riffle 10/15/2014, 7:46 AM Infectious Diseases Pharmacist Phone# [phone]

## 2014-10-17 ENCOUNTER — Ambulatory Visit
Admission: RE | Admit: 2014-10-17 | Discharge: 2014-10-17 | Disposition: A | Discharge status: Home / Self Care | Payer: Medicare Other | Source: Ambulatory Visit | Attending: Radiation Oncology | Admitting: Radiation Oncology

## 2014-10-17 ENCOUNTER — Telehealth: Payer: Self-pay | Admitting: *Deleted

## 2014-10-17 DIAGNOSIS — C7989 Secondary malignant neoplasm of other specified sites: Secondary | ICD-10-CM

## 2014-10-17 LAB — CULTURE, BLOOD (ROUTINE X 2)
Culture: NO GROWTH
Culture: NO GROWTH

## 2014-10-17 NOTE — Telephone Encounter (Signed)
  Oncology Nurse Navigator Documentation   Navigator Encounter Type: Telephone (10/17/14 1003)     Received internal message that patient dtr called to cancel today's CT SIM b/c her dad is sick.  CT SIM and Dr. Isidore Moos notified.   Gayleen Orem, RN, BSN, [place] at [place] [phone]

## 2014-10-19 ENCOUNTER — Telehealth: Payer: Self-pay | Admitting: *Deleted

## 2014-10-19 NOTE — Telephone Encounter (Signed)
  Oncology Nurse Navigator Documentation   Navigator Encounter Type: Telephone (10/19/14 1347)     In follow-up to patient dtr's call, arranged for rescheduling of [facility] for Friday 8/12 2 1000.  She verbalized understanding of appt, guidance to arrive to Radiation Oncology Waiting by 0945.   Gayleen Orem, RN, BSN, [place] at [place] [phone]

## 2014-10-20 ENCOUNTER — Ambulatory Visit (INDEPENDENT_AMBULATORY_CARE_PROVIDER_SITE_OTHER): Payer: Medicare Other

## 2014-10-20 DIAGNOSIS — I482 Chronic atrial fibrillation, unspecified: Secondary | ICD-10-CM

## 2014-10-20 DIAGNOSIS — Z5181 Encounter for therapeutic drug level monitoring: Secondary | ICD-10-CM | POA: Diagnosis not present

## 2014-10-20 LAB — POCT INR: INR: 1.5

## 2014-10-21 ENCOUNTER — Ambulatory Visit
Admission: RE | Admit: 2014-10-21 | Discharge: 2014-10-21 | Disposition: A | Discharge status: Home / Self Care | Payer: Medicare Other | Source: Ambulatory Visit | Attending: Radiation Oncology | Admitting: Radiation Oncology

## 2014-10-21 ENCOUNTER — Encounter: Payer: Self-pay | Admitting: *Deleted

## 2014-10-21 DIAGNOSIS — Z510 Encounter for antineoplastic radiation therapy: Secondary | ICD-10-CM | POA: Diagnosis not present

## 2014-10-21 DIAGNOSIS — C7989 Secondary malignant neoplasm of other specified sites: Secondary | ICD-10-CM

## 2014-10-21 NOTE — Progress Notes (Signed)
  Oncology Nurse Navigator Documentation   Navigator Encounter Type: Clinic/MDC (10/21/14 1015)     Barriers/Navigation Needs: No barriers at this time (10/21/14 1015)       To provide support and encouragement, care continuity and to assess for needs, met with patient during CT SIM.  His dtr Pam [place] accompanied him.  Spoke with dtr during SIM, explained my role as navigator, provided her my business card.    He tolerated SIM without difficulty, he denied concerns.  I provide tour of [facility], reviewed registration, check-in and preparation procedures for daily tmt.  They verbalized understanding. They understand I can be contacted with needs/concerns.  Gayleen Orem, RN, BSN, [place] at [place] [phone]

## 2014-10-21 NOTE — Progress Notes (Signed)
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 An Aquaplast head and shoulder mask was custom fitted to the patient's anatomy. High-resolution CT axial imaging was obtained of the head and neck with contrast. I verified that the quality of the imaging is good for treatment planning. 1 Medically Necessary Treatment Device was fabricated and supervised by me: Aquaplast mask.   Treatment planning note I plan to treat the patient with IMRT. I plan to treat the patient's right parotid tumor and proximal upper right neck nodes. I plan to treat to a total dose of 48 Gray in 16  fractions. Dose calculation was ordered from dosimetry.  IMRT planning Note  IMRT is an important modality to deliver adequate dose to the patient's at risk tissues while sparing the patient's normal structures, including the: esophagus, parotid tissue, mandible, brain stem, spinal cord, oral cavity, brachial plexus.  This justifies the use of IMRT in the patient's treatment.     -----------------------------------  Eppie Gibson, MD

## 2014-10-24 DIAGNOSIS — Z510 Encounter for antineoplastic radiation therapy: Secondary | ICD-10-CM | POA: Diagnosis not present

## 2014-10-26 ENCOUNTER — Ambulatory Visit
Admission: RE | Admit: 2014-10-26 | Discharge: 2014-10-26 | Disposition: A | Discharge status: Home / Self Care | Payer: Medicare Other | Source: Ambulatory Visit | Attending: Radiation Oncology | Admitting: Radiation Oncology

## 2014-10-26 DIAGNOSIS — C7989 Secondary malignant neoplasm of other specified sites: Secondary | ICD-10-CM

## 2014-10-26 DIAGNOSIS — Z510 Encounter for antineoplastic radiation therapy: Secondary | ICD-10-CM | POA: Diagnosis not present

## 2014-10-26 NOTE — Progress Notes (Signed)
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 The code is [phone].  -----------------------------------  Eppie Gibson, MD

## 2014-10-27 ENCOUNTER — Telehealth: Payer: Self-pay | Admitting: *Deleted

## 2014-10-27 ENCOUNTER — Ambulatory Visit: Payer: Medicare Other

## 2014-10-27 ENCOUNTER — Ambulatory Visit
Admission: RE | Admit: 2014-10-27 | Discharge: 2014-10-27 | Disposition: A | Discharge status: Home / Self Care | Payer: Medicare Other | Source: Ambulatory Visit | Attending: Radiation Oncology | Admitting: Radiation Oncology

## 2014-10-27 NOTE — Telephone Encounter (Signed)
  Oncology Nurse Navigator Documentation   Navigator Encounter Type: Telephone (10/27/14 1052)     Called pt dtr after notification from [place] that he cancelled today's appt, stated he no longer wants to continue with tmt.  LVM with request she return my call to provide further information.  Gayleen Orem, RN, BSN, [place] at [place] [phone]

## 2014-10-27 NOTE — Telephone Encounter (Signed)
  Oncology Nurse Navigator Documentation   Navigator Encounter Type: Telephone (10/27/14 1705)     Patient's dtr returned my VM.  She indicated her dad called her this morning with the decision that he did not want to proceed with further RT, in part b/c he is already feeling SEs after just a couple tmts.  He expressed that he did want to come in to see Dr. Isidore Moos to discuss.  I noted he has an [place] appt on Monday at 10:45, I would notify her if Dr. Isidore Moos would like to see him at a different time.  She verbalized understanding.  Gayleen Orem, RN, BSN, [place] at [place] [phone]

## 2014-10-28 ENCOUNTER — Ambulatory Visit: Payer: Medicare Other

## 2014-10-28 ENCOUNTER — Ambulatory Visit (INDEPENDENT_AMBULATORY_CARE_PROVIDER_SITE_OTHER): Payer: Medicare Other | Admitting: Pharmacist

## 2014-10-28 ENCOUNTER — Ambulatory Visit
Admission: RE | Admit: 2014-10-28 | Discharge: 2014-10-28 | Disposition: A | Discharge status: Home / Self Care | Payer: Medicare Other | Source: Ambulatory Visit | Attending: Radiation Oncology | Admitting: Radiation Oncology

## 2014-10-28 DIAGNOSIS — I482 Chronic atrial fibrillation, unspecified: Secondary | ICD-10-CM

## 2014-10-28 DIAGNOSIS — Z5181 Encounter for therapeutic drug level monitoring: Secondary | ICD-10-CM

## 2014-10-28 LAB — POCT INR: INR: 4.5

## 2014-10-31 ENCOUNTER — Ambulatory Visit: Payer: Medicare Other

## 2014-10-31 ENCOUNTER — Ambulatory Visit: Admission: RE | Admit: 2014-10-31 | Payer: Medicare Other | Source: Ambulatory Visit | Admitting: Radiation Oncology

## 2014-11-01 ENCOUNTER — Telehealth: Payer: Self-pay | Admitting: *Deleted

## 2014-11-01 ENCOUNTER — Ambulatory Visit: Payer: Medicare Other

## 2014-11-01 ENCOUNTER — Ambulatory Visit: Payer: Medicare Other | Admitting: Radiation Oncology

## 2014-11-01 NOTE — Telephone Encounter (Signed)
  Oncology Nurse Navigator Documentation   Navigator Encounter Type: Telephone (11/01/14 1713)      Spoke with patient dtr.  She stated misunderstanding about yesterday's appt with Dr. Isidore Moos, that she and her dad do want to meet to discuss his treatment decision with her.  I indicated to plan for next Monday at 1030 which corresponds to what would be his weekly UT appt.  She verbalized understanding.  Gayleen Orem, RN, BSN, [place] at [place] [phone]

## 2014-11-02 ENCOUNTER — Ambulatory Visit
Admission: RE | Admit: 2014-11-02 | Discharge: 2014-11-02 | Disposition: A | Discharge status: Home / Self Care | Payer: Medicare Other | Source: Ambulatory Visit | Attending: Radiation Oncology | Admitting: Radiation Oncology

## 2014-11-02 ENCOUNTER — Ambulatory Visit: Payer: Medicare Other

## 2014-11-03 ENCOUNTER — Ambulatory Visit
Admission: RE | Admit: 2014-11-03 | Discharge: 2014-11-03 | Disposition: A | Discharge status: Home / Self Care | Payer: Medicare Other | Source: Ambulatory Visit | Attending: Radiation Oncology | Admitting: Radiation Oncology

## 2014-11-03 ENCOUNTER — Ambulatory Visit: Payer: Medicare Other

## 2014-11-04 ENCOUNTER — Ambulatory Visit: Payer: Medicare Other

## 2014-11-04 ENCOUNTER — Ambulatory Visit (INDEPENDENT_AMBULATORY_CARE_PROVIDER_SITE_OTHER): Payer: Medicare Other | Admitting: *Deleted

## 2014-11-04 ENCOUNTER — Ambulatory Visit
Admission: RE | Admit: 2014-11-04 | Discharge: 2014-11-04 | Disposition: A | Discharge status: Home / Self Care | Payer: Medicare Other | Source: Ambulatory Visit | Attending: Radiation Oncology | Admitting: Radiation Oncology

## 2014-11-04 DIAGNOSIS — Z5181 Encounter for therapeutic drug level monitoring: Secondary | ICD-10-CM | POA: Diagnosis not present

## 2014-11-04 DIAGNOSIS — I482 Chronic atrial fibrillation, unspecified: Secondary | ICD-10-CM

## 2014-11-04 LAB — POCT INR: INR: 2.9

## 2014-11-07 ENCOUNTER — Ambulatory Visit
Admission: RE | Admit: 2014-11-07 | Discharge: 2014-11-07 | Disposition: A | Discharge status: Home / Self Care | Payer: Medicare Other | Source: Ambulatory Visit | Attending: Radiation Oncology | Admitting: Radiation Oncology

## 2014-11-07 ENCOUNTER — Encounter: Payer: Self-pay | Admitting: Radiation Oncology

## 2014-11-07 ENCOUNTER — Ambulatory Visit: Payer: Medicare Other

## 2014-11-07 ENCOUNTER — Other Ambulatory Visit: Payer: Self-pay | Admitting: Radiation Oncology

## 2014-11-07 ENCOUNTER — Encounter: Payer: Self-pay | Admitting: *Deleted

## 2014-11-07 VITALS — BP 122/67 | HR 67 | Temp 98.5°F | Resp 16 | Wt 189.9 lb

## 2014-11-07 DIAGNOSIS — C7989 Secondary malignant neoplasm of other specified sites: Secondary | ICD-10-CM

## 2014-11-07 DIAGNOSIS — Z510 Encounter for antineoplastic radiation therapy: Secondary | ICD-10-CM | POA: Diagnosis not present

## 2014-11-07 MED ORDER — LIDOCAINE VISCOUS 2 % MT SOLN
OROMUCOSAL | Status: AC
Start: 2014-11-07 — End: ?

## 2014-11-07 NOTE — Progress Notes (Signed)
    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  CBCT/MVCT images/Port film x-rays were reviewed.  The chart was checked.  Weight and vitals stable. Denies pain. Reports he is unsure if he wants to resume treatment. Patient here with daughter to discuss his future where radiation is concerned. Patient reports that following his initial treatment the side effects where such he wasn't sure he wanted to endure another one. Patient and daughter want to discuss the stage of his cancer, if it is aggressive or not, and what the future will look like for him if he decides not to pursue treatment. Reports intermittently he wakes during the night with right upper neck pain that resolves with tramadol. Does not feel that mass has grown.  Stopped RT after first fraction due to perceived mouth pain, dry mouth  Physical Findings:  Wt Readings from Last 3 Encounters:  11/07/14 189 lb 14.4 oz (86.138 kg)  09/22/14 193 lb 12.8 oz (87.907 kg)  02/01/14 186 lb (84.369 kg)    weight is 189 lb 14.4 oz (86.138 kg). His oral temperature is 98.5 F (36.9 C). His blood pressure is 122/67 and his pulse is 67. His respiration is 16 and oxygen saturation is 100%.   Palpable right parotid mass, skin overlying area is erythematous  CBC    Component Value Date/Time   WBC 7.1 10/12/2014 1630   RBC 4.30 10/12/2014 1630   HGB 14.1 10/12/2014 1630   HCT 42.8 10/12/2014 1630   PLT 123* 10/12/2014 1630   MCV 99.5 10/12/2014 1630   MCH 32.8 10/12/2014 1630   MCHC 32.9 10/12/2014 1630   RDW 14.3 10/12/2014 1630   LYMPHSABS 0.8 10/12/2014 1630   MONOABS 0.4 10/12/2014 1630   EOSABS 0.0 10/12/2014 1630   BASOSABS 0.0 10/12/2014 1630     CMP     Component Value Date/Time   NA 137 10/12/2014 1630   K 4.5  10/12/2014 1630   CL 104 10/12/2014 1630   CO2 26 10/12/2014 1630   GLUCOSE 126* 10/12/2014 1630   BUN 15 10/12/2014 1630   CREATININE 0.91 10/12/2014 1630   CALCIUM 8.9 10/12/2014 1630   PROT 6.6 01/28/2014 1402   ALBUMIN 3.5 01/28/2014 1402   AST 20 01/28/2014 1402   ALT 16 01/28/2014 1402   ALKPHOS 54 01/28/2014 1402   BILITOT 0.9 01/28/2014 1402   GFRNONAA >60 10/12/2014 1630   GFRAA >60 10/12/2014 1630     Impression:  The patient would like to resume RT after an extensive discussion with him re: the pros and cons of treatment  Plan: resume radiotherapy as planned.   -----------------------------------  Eppie Gibson, MD

## 2014-11-07 NOTE — Progress Notes (Signed)
  Oncology Nurse Navigator Documentation   Navigator Encounter Type: Clinic/MDC (11/07/14 1045) Patient Visit Type: VOPFYT (11/07/14 1045) Treatment Phase: Treatment (11/07/14 1045)     Interventions: Coordination of Care (11/07/14 1045)       To provide support and encouragement, care continuity and to assess for needs, met with patient during f/u appt with Dr. Isidore Moos.  He was accompanied by his dtr Pam. He verbalized understanding of the pros and cons of starting RT again vs no tmt at all as explained by Dr. Isidore Moos. He chose to pursue tmt, verbalized understanding that he must complete a minimum of 10 tmts to gain any efficacy, can take a short break if needed. I escorted him to [facility] where he reinitiated tmt, supported him during tmt.  He tolerated procedure without difficulty.  Gayleen Orem, RN, BSN, [place] at [place] [phone]          Time Spent with Patient: 60 (11/07/14 1045)

## 2014-11-07 NOTE — Progress Notes (Addendum)
Weight and vitals stable. Denies pain. Reports he is unsure if he wants to resume treatment. Patient here with daughter to discuss his future where radiation is concerned. Patient reports that following his initial treatment the side effects where such he wasn't sure he wanted to endure another one. Patient and daughter want to discuss the stage of his cancer, if it is aggressive or not, and what the future will look like for him if he decides not to pursue treatment. Reports intermittently he wakes during the night with right upper neck pain that resolves with tramadol. Reports rarely does he strangle when swallowing food.  BP 122/67 mmHg  Pulse 67  Temp(Src) 98.5 F (36.9 C) (Oral)  Resp 16  Wt 189 lb 14.4 oz (86.138 kg)  SpO2 100% Wt Readings from Last 3 Encounters:  11/07/14 189 lb 14.4 oz (86.138 kg)  09/22/14 193 lb 12.8 oz (87.907 kg)  02/01/14 186 lb (84.369 kg)

## 2014-11-08 ENCOUNTER — Ambulatory Visit
Admission: RE | Admit: 2014-11-08 | Discharge: 2014-11-08 | Disposition: A | Discharge status: Home / Self Care | Payer: Medicare Other | Source: Ambulatory Visit | Attending: Radiation Oncology | Admitting: Radiation Oncology

## 2014-11-08 ENCOUNTER — Telehealth: Payer: Self-pay | Admitting: *Deleted

## 2014-11-08 ENCOUNTER — Ambulatory Visit: Payer: Medicare Other

## 2014-11-08 NOTE — Telephone Encounter (Signed)
  Oncology Nurse Navigator Documentation   Navigator Encounter Type: Telephone (11/08/14 1031)         Interventions: Coordination of Care (11/08/14 1031)       Returned VM from patient dtr indicating Dustin Bean does not want to continue with RT.  Spoke with Dustin Bean. He stated there was nothing specific to yesterday's tmt that caused him to change his mind, rather his decision is based on further thought and consideration. He expressed deep appreciation for the support and patience he has received at [facility]. I informed Dr. Isidore Moos and [facility] of his decision.  Gayleen Orem, RN, BSN, [place] at [place] [phone]         Time Spent with Patient: 15 (11/08/14 1031)

## 2014-11-08 NOTE — Addendum Note (Signed)
Encounter addended by: Eppie Gibson, MD on: 11/08/2014  3:46 PM<BR>     Documentation filed: Notes Section

## 2014-11-09 ENCOUNTER — Ambulatory Visit: Payer: Medicare Other

## 2014-11-10 ENCOUNTER — Ambulatory Visit: Payer: Medicare Other

## 2014-11-11 ENCOUNTER — Ambulatory Visit: Payer: Medicare Other

## 2014-11-15 ENCOUNTER — Ambulatory Visit: Payer: Medicare Other

## 2014-11-16 ENCOUNTER — Ambulatory Visit: Payer: Medicare Other

## 2014-11-17 ENCOUNTER — Ambulatory Visit: Payer: Medicare Other

## 2014-11-18 ENCOUNTER — Ambulatory Visit: Payer: Medicare Other

## 2014-11-18 ENCOUNTER — Ambulatory Visit (INDEPENDENT_AMBULATORY_CARE_PROVIDER_SITE_OTHER): Payer: Medicare Other | Admitting: *Deleted

## 2014-11-18 DIAGNOSIS — I482 Chronic atrial fibrillation, unspecified: Secondary | ICD-10-CM

## 2014-11-18 DIAGNOSIS — Z5181 Encounter for therapeutic drug level monitoring: Secondary | ICD-10-CM | POA: Diagnosis not present

## 2014-11-18 LAB — POCT INR: INR: 2.8

## 2014-11-21 ENCOUNTER — Ambulatory Visit: Payer: Medicare Other

## 2014-11-22 ENCOUNTER — Ambulatory Visit: Payer: Medicare Other

## 2014-11-23 ENCOUNTER — Ambulatory Visit: Payer: Medicare Other

## 2014-11-24 ENCOUNTER — Ambulatory Visit: Payer: Medicare Other

## 2014-11-25 ENCOUNTER — Ambulatory Visit: Payer: Medicare Other

## 2014-11-28 ENCOUNTER — Ambulatory Visit: Payer: Medicare Other

## 2014-11-29 ENCOUNTER — Ambulatory Visit: Payer: Medicare Other

## 2014-11-30 ENCOUNTER — Ambulatory Visit: Payer: Medicare Other

## 2014-12-05 ENCOUNTER — Encounter: Payer: Self-pay | Admitting: Radiation Oncology

## 2014-12-05 ENCOUNTER — Telehealth: Payer: Self-pay | Admitting: *Deleted

## 2014-12-05 NOTE — Progress Notes (Signed)
  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  He expressed hesitancy about treatment after fraction one and had a followup with Dr Isidore Moos 12 days later to discuss pros and cons of continuing. He decided to receive a second fraction on 8-29 but then chose to stop treatment altogether. He did not report any severe side effects.  Plan: Patient fully understands the pros and cons on terminating radiotherapy. He has been released back to Dr. Redmond Baseman, ENT surgeon.  -----------------------------------  Eppie Gibson, MD

## 2014-12-05 NOTE — Telephone Encounter (Signed)
  Oncology Nurse Navigator Documentation   Navigator Encounter Type: Telephone (12/05/14 5929)         Interventions: Coordination of Care (12/05/14 2446)     Per Dr. Pearlie Oyster guidance, called [facility]NT to arrange follow-up visit.  Spoke with Kalman Shan, requested that patient be contacted and f/u appt arranged to see Dr. Redmond Baseman in 3-4 weeks.  I noted pt refused RT, Dr. Redmond Baseman should be aware.   She verbalized understanding.  Gayleen Orem, RN, BSN, [place] at [place] [phone]            Time Spent with Patient: 15 (12/05/14 0959)

## 2014-12-09 ENCOUNTER — Ambulatory Visit (INDEPENDENT_AMBULATORY_CARE_PROVIDER_SITE_OTHER): Payer: Medicare Other

## 2014-12-09 DIAGNOSIS — Z5181 Encounter for therapeutic drug level monitoring: Secondary | ICD-10-CM

## 2014-12-09 DIAGNOSIS — I482 Chronic atrial fibrillation, unspecified: Secondary | ICD-10-CM

## 2014-12-09 LAB — POCT INR: INR: 2.5

## 2015-01-06 ENCOUNTER — Ambulatory Visit (INDEPENDENT_AMBULATORY_CARE_PROVIDER_SITE_OTHER): Payer: Medicare Other | Admitting: Pharmacist

## 2015-01-06 DIAGNOSIS — Z5181 Encounter for therapeutic drug level monitoring: Secondary | ICD-10-CM | POA: Diagnosis not present

## 2015-01-06 DIAGNOSIS — I482 Chronic atrial fibrillation, unspecified: Secondary | ICD-10-CM

## 2015-01-06 LAB — POCT INR: INR: 2.5

## 2015-01-27 ENCOUNTER — Encounter: Payer: Self-pay | Admitting: Radiation Oncology

## 2015-01-27 ENCOUNTER — Telehealth: Payer: Self-pay | Admitting: *Deleted

## 2015-01-27 ENCOUNTER — Other Ambulatory Visit: Payer: Self-pay | Admitting: Radiation Oncology

## 2015-01-27 DIAGNOSIS — C7989 Secondary malignant neoplasm of other specified sites: Secondary | ICD-10-CM

## 2015-01-27 NOTE — Progress Notes (Signed)
Order placed for would care consult in response to VMM from Dr. Redmond Baseman' RN  indicating Dr. Redmond Baseman rec'd call from patient stating he is having drainage from tumor site.  I also asked Gayleen Orem, RN, our Head and Neck Oncology Navigator to let family know referral was made, and ask if they are interested in considering a short course of palliative RT to the tumor (I would give more superficial RT that the regimen we attempted the first time, and to a smaller volume of tissue, which should substantially decrease risk of significant side effects). If they are interested,he will arrange a followup.  -----------------------------------  Eppie Gibson, MD

## 2015-01-27 NOTE — Telephone Encounter (Signed)
CALLED PATIENT TO INFORM OF WOUND CARE CLINIC APPT. ON 03/07/15 - ARRIVAL TIME - 9 AM @ THE WOUND CARE CLINIC, SPOKE WITH PATIENT AND HE IS AWARE OF THIS APPT.

## 2015-01-31 ENCOUNTER — Telehealth: Payer: Self-pay | Admitting: *Deleted

## 2015-01-31 NOTE — Telephone Encounter (Addendum)
  Oncology Nurse Navigator Documentation   Navigator Encounter Type: Telephone;Education (01/31/15 1112)   Treatment Phase: Other (01/31/15 1112)      Mr. Dustin Bean dtr, Pam, returned my VMM left yesterday.  She shared he has been increasingly weak and unsteady, is now living with her and her husband.  Is being evaluated today for hospice enrollment with [facility] s/p referral by his PCP and Dr. Redmond Baseman.    She expressed her concerns about being able to care for him at home.  I encouraged her to share her concerns with [facility], assured her they will provide the necessary services to provide him optimal care.  She noted they may be offering residential care at [facility].  She stated that they are not interested in the palliative RT offered by Dr. Isidore Moos. She thanked me for my call, expressed appreciation for my continuing support. Dr. Isidore Moos provided update.  Gayleen Orem, RN, BSN, [place] at [place] [phone]               Time Spent with Patient: 30 (01/31/15 1112)

## 2015-02-06 ENCOUNTER — Ambulatory Visit (INDEPENDENT_AMBULATORY_CARE_PROVIDER_SITE_OTHER): Payer: Medicare Other | Admitting: Pharmacist

## 2015-02-06 DIAGNOSIS — I482 Chronic atrial fibrillation, unspecified: Secondary | ICD-10-CM

## 2015-02-06 DIAGNOSIS — Z5181 Encounter for therapeutic drug level monitoring: Secondary | ICD-10-CM

## 2015-02-06 LAB — POCT INR: INR: 2.8

## 2015-02-08 ENCOUNTER — Emergency Department (HOSPITAL_COMMUNITY)
Admission: EM | Admit: 2015-02-08 | Discharge: 2015-02-08 | Disposition: A | Discharge status: Home / Self Care | Attending: Emergency Medicine | Admitting: Emergency Medicine

## 2015-02-08 ENCOUNTER — Emergency Department (HOSPITAL_COMMUNITY)

## 2015-02-08 ENCOUNTER — Encounter (HOSPITAL_COMMUNITY): Payer: Self-pay | Admitting: *Deleted

## 2015-02-08 DIAGNOSIS — Y998 Other external cause status: Secondary | ICD-10-CM | POA: Insufficient documentation

## 2015-02-08 DIAGNOSIS — Z85828 Personal history of other malignant neoplasm of skin: Secondary | ICD-10-CM | POA: Diagnosis not present

## 2015-02-08 DIAGNOSIS — Y9389 Activity, other specified: Secondary | ICD-10-CM | POA: Diagnosis not present

## 2015-02-08 DIAGNOSIS — S8991XA Unspecified injury of right lower leg, initial encounter: Secondary | ICD-10-CM | POA: Diagnosis not present

## 2015-02-08 DIAGNOSIS — Z79899 Other long term (current) drug therapy: Secondary | ICD-10-CM | POA: Insufficient documentation

## 2015-02-08 DIAGNOSIS — Y9289 Other specified places as the place of occurrence of the external cause: Secondary | ICD-10-CM | POA: Diagnosis not present

## 2015-02-08 DIAGNOSIS — W19XXXA Unspecified fall, initial encounter: Secondary | ICD-10-CM | POA: Insufficient documentation

## 2015-02-08 DIAGNOSIS — I10 Essential (primary) hypertension: Secondary | ICD-10-CM | POA: Insufficient documentation

## 2015-02-08 DIAGNOSIS — Z7901 Long term (current) use of anticoagulants: Secondary | ICD-10-CM | POA: Diagnosis not present

## 2015-02-08 DIAGNOSIS — M25561 Pain in right knee: Secondary | ICD-10-CM

## 2015-02-08 DIAGNOSIS — Z8669 Personal history of other diseases of the nervous system and sense organs: Secondary | ICD-10-CM | POA: Diagnosis not present

## 2015-02-08 DIAGNOSIS — I509 Heart failure, unspecified: Secondary | ICD-10-CM | POA: Diagnosis not present

## 2015-02-08 HISTORY — DX: Heart failure, unspecified: I50.9

## 2015-02-08 NOTE — ED Notes (Signed)
Pt arrives from home via [facility]. Pt had a fall this AM around 0800. Pt denied treatment from EMS at time of fall but later began to have pain in his right leg. Pt denies LOC and striking of the head. Pt is on Coumadin. Pt denies pain at rest but on movement 5/10 on pain scale but c/o of a Headache 6/10 on pain scale.

## 2015-02-08 NOTE — ED Notes (Signed)
Per pt request, I called daughter Jeannene Patella to arrange pick up due to pt being discharged from the hospital. Daughter became instantly irate with this RN and stated how in the "hell" do I expect a 81 year old women with a bad back to come get her 74 something year old father. This RN then explain to the daughter that I was unaware of her health issues and proceeded to explain to her that we could arrange for the ambulance service to bring her father home. She then stated that I am "crazy" and that I have lost my mind and continued to use profanity. She also stated that he could not come home at cost because she is unable to care for him at this time and he needed a higher level of care. She stated she will contact the nurse at hospice. This RN stated okay please just call me back when you find out anything out or hospice can give me a call and I provided a number for call back and then stated I will get in touch with the case manager of the ED. This RN then consulted with Mariann Laster the case manager and she then became in contact with hospice to make further arrangements.

## 2015-02-08 NOTE — ED Notes (Signed)
Pt complaining of his ear hurting. Nurse notified.

## 2015-02-08 NOTE — ED Notes (Signed)
Case manager informed this RN that the pt will be going to [employer].

## 2015-02-08 NOTE — ED Notes (Addendum)
Pt belongings including blue heated bolanket, depends, and meds (lasix, Atenolol, Vit D3, Super B complex, B12, Mvt, Lovastatin, Coumadin, Omperazole x2 bottles, potassium chloride) sent with pt to [facility] by [facility].

## 2015-02-08 NOTE — ED Provider Notes (Signed)
CSN: [id number]     Arrival date & time 02/08/15  1701 History   First MD Initiated Contact with Patient 02/08/15 1702     Chief Complaint  Patient presents with  . Fall     (Consider location/radiation/quality/duration/timing/severity/associated sxs/prior Treatment) HPI Comments: Patient presents to the emergency department with chief complaint of fall. Patient states that his right knee gave out from underneath him this morning. He was initially treated by EMS, but then began to have increasing right knee pain, and was brought to the hospital. Denies any head injury or loss of consciousness. Patient does complain of right ear pain.  States that he is being treated for skin cancer in this location. There is Silvadene cream that has been applied to the area. Patient denies any headache, chest pain, shortness breath, abdominal pain, or other symptoms. Patient states that his knee pain is worsened when he stands up. There are no other associated symptoms.  The history is provided by the patient. No language interpreter was used.    Past Medical History  Diagnosis Date  . Atrial fibrillation ([place])   . Hearing loss   . Skin cancer 05/17/14    LEFT INFERIOR CENTRAL ANTERIOR SCALP,SQUAMOUS CELL CA IN SITU,BASE INVOLVED  . Cancer ([place]) 08/25/14    PAROTID GLAND,RIGHT TAIL,NEOPLASTIC PROCESS,INCLUDING SQUAMOUS CELL CA  CANT BE R/O   . Hypertension   . CHF (congestive heart failure) [facility])    Past Surgical History  Procedure Laterality Date  . Spine surgery      x 4   Family History  Problem Relation Age of Onset  . Kidney failure Father   . Diabetes Brother    Social History  Substance Use Topics  . Smoking status: Never Smoker   . Smokeless tobacco: Never Used  . Alcohol Use: Yes     Comment: rarely    Review of Systems  Constitutional: Negative for fever and chills.  Respiratory: Negative for shortness of breath.   Cardiovascular: Negative for chest pain.  Gastrointestinal:  Negative for nausea, vomiting, diarrhea and constipation.  Genitourinary: Negative for dysuria.  Musculoskeletal: Positive for arthralgias.  All other systems reviewed and are negative.     Allergies  Review of patient's allergies indicates no known allergies.  Home Medications   Prior to Admission medications   Medication Sig Start Date End Date Taking? Authorizing Provider  Ascorbic Acid (VITAMIN C) 1000 MG tablet Take 1,000 mg by mouth daily.    Historical Provider, MD  atenolol (TENORMIN) 50 MG tablet Take 50 mg by mouth daily.    Historical Provider, MD  Calcium-Magnesium-Vitamin D 400-166.7-133.3 MG-MG-UNIT TABS Take 1 tablet by mouth daily.    Historical Provider, MD  cholecalciferol (VITAMIN D) 1000 UNITS tablet Take 1,000 Units by mouth daily.     Historical Provider, MD  Cyanocobalamin (VITAMIN B-12) 2000 MCG TBCR Take 2,000 mcg by mouth daily.  07/30/10   Historical Provider, MD  fexofenadine (ALLEGRA) 180 MG tablet Take 180 mg by mouth daily.    Historical Provider, MD  furosemide (LASIX) 20 MG tablet Take 20 mg by mouth daily.    Historical Provider, MD  lidocaine (XYLOCAINE) 2 % solution Patient: Mix 1part 2% viscous lidocaine, 1part H20. Swish and/or swallow 7.53mL of this mixture, 21min before meals and at bedtime, up to QID 11/07/14   Eppie Gibson, MD  lovastatin (MEVACOR) 40 MG tablet Take 40 mg by mouth at bedtime.    Historical Provider, MD  Omega-3 Fatty Acids ([place])  1000 MG CAPS Take 1,000 mg by mouth daily.     Historical Provider, MD  potassium chloride SA (K-DUR,KLOR-CON) 20 MEQ tablet Take 20 mEq by mouth daily.    Historical Provider, MD  tamsulosin (FLOMAX) 0.4 MG CAPS capsule Take 0.4 mg by mouth daily.    Historical Provider, MD  traMADol (ULTRAM) 50 MG tablet Take 0.5 tablets (25 mg total) by mouth every 6 (six) hours as needed for severe pain. 10/11/14   Deno Etienne, DO  warfarin (COUMADIN) 1 MG tablet Take 6-6.5 tablets (6-6.5 mg total) by mouth daily. Takes  with 5mg  to get a total of 6mg  or 6.5mg   Takes 6mg  every other day, then takes 6.5mg  every other day Patient not taking: Reported on 10/11/2014 08/12/14   Darlin Coco, MD  warfarin (COUMADIN) 5 MG tablet Take 1 tablet (5 mg total) by mouth daily. 08/12/14   Darlin Coco, MD   BP 112/61 mmHg  Pulse 85  Temp(Src) 97.6 F (36.4 C) (Oral)  Resp 18  SpO2 97% Physical Exam  Constitutional: He is oriented to person, place, and time. He appears well-developed and well-nourished.  HENT:  Head: Normocephalic and atraumatic.  Eyes: Conjunctivae and EOM are normal. Pupils are equal, round, and reactive to light. Right eye exhibits no discharge. Left eye exhibits no discharge. No scleral icterus.  Neck: Normal range of motion. Neck supple. No JVD present.  Cardiovascular: Normal rate, regular rhythm and normal heart sounds.  Exam reveals no gallop and no friction rub.   No murmur heard. Pulmonary/Chest: Effort normal and breath sounds normal. No respiratory distress. He has no wheezes. He has no rales. He exhibits no tenderness.  Abdominal: Soft. He exhibits no distension and no mass. There is no tenderness. There is no rebound and no guarding.  Musculoskeletal: Normal range of motion. He exhibits no edema or tenderness.  No bony abnormality or deformity of right knee Range of motion and strength of lower extremities is 5/5  Neurological: He is alert and oriented to person, place, and time.  Skin: Skin is warm and dry.  Psychiatric: He has a normal mood and affect. His behavior is normal. Judgment and thought content normal.  Nursing note and vitals reviewed.   ED Course  Procedures (including critical care time) Labs Review Labs Reviewed - No data to display  Imaging Review Dg Knee Complete 4 Views Right  02/08/2015  CLINICAL DATA:  Fall, acute right knee pain anteriorly. EXAM: RIGHT KNEE - COMPLETE 4+ VIEW COMPARISON:  None. FINDINGS: Bones are osteopenic. Degenerative arthritis of the  right knee with chondrocalcinosis diffusely. There is joint space loss and sclerosis. Bony spurring of the medial tibial plateau. No acute fracture, malalignment or effusion. Peripheral atherosclerosis noted. IMPRESSION: Osteopenia and degenerative changes. Chondrocalcinosis No acute osseous finding or effusion Atherosclerosis Electronically Signed   By: Jerilynn Mages.  Shick M.D.   On: 02/08/2015 18:00   I have personally reviewed and evaluated these images and lab results as part of my medical decision-making.   EKG Interpretation None      MDM   Final diagnoses:  Right knee pain    Patient with right knee pain. Plain films are negative. Patient is well-appearing. He is alert and oriented. Is not in any apparent distress.  Will plan for discharge to home.  PCP follow-up.  Patient seen by and discussed with Dr. Jeneen Rinks, who agrees with plan.    Montine Circle, PA-C 02/08/15 1948  Tanna Furry, MD 02/22/15 [phone]

## 2015-02-08 NOTE — Discharge Instructions (Signed)

## 2015-02-08 NOTE — Progress Notes (Signed)
ED CM spoke with Kennyth Lose RN on Pod E regarding patient being medically cleared for discharge, and family not wanting to have him return home.  Patient is under [facility] care. Spoke with Santiago Glad at [facility] she states, she spoke daughter earlier, and may need some additional support surrounding hospice care. She contact her and follow up with ED CM. Awaiting call from [facility].

## 2015-02-08 NOTE — ED Notes (Signed)
Pt requested that I call his daughter so that he can speak with her. Phone call placed for pt from pt phone located in the room.

## 2015-02-08 NOTE — Progress Notes (Signed)
Cm received call from Santiago Glad with Hospice who advised that she spoke with the daughter Jeannene Patella and was able to arrange for patient to go to [facility] for hospice treatment. Santiago Glad states that she explained in depth to the daughter that the patient may not qualify once there so she would be responsible for room and board and per conversation, daughter verbalized understanding. [facility] is on a sliding scale. Patient is to go to [facility], RN to call report and arrange [facility]AR. CM remains available should any additional needs arise.

## 2015-02-08 NOTE — ED Notes (Signed)
Patient transported to X-ray 

## 2015-02-08 NOTE — ED Provider Notes (Addendum)
Patient seen and evaluated. Has normal range of motion of the his knee. Normal head and neck exam. No complaint of headache. Triage note states complain of headache. He indicates a small area behind his right ear where he has been "treating it" with some antibiotic cream or had a previous skin cancer.  Was able to stand and bear weight. He does complain of pain in the area of the knee. No clinical or radiographic effusion.  Patient does not have pain with manipulation of the hip, palpation of the hip, or have complaints about the hip.  I felt he was appropriate for discharge. Nurse contacted his daughter. Daughter responded with a profanity laden response back to the nurse that "you can bring him back here I won't take him!! You guys have to put him somewhere!!  I sent him there so that he could be placed somewhere".  We will ask case management to assist with disposition.  Tanna Furry, MD 02/08/15 1945  Tanna Furry, MD 02/08/15 [phone]

## 2015-02-14 ENCOUNTER — Encounter (HOSPITAL_BASED_OUTPATIENT_CLINIC_OR_DEPARTMENT_OTHER): Payer: Medicare Other

## 2015-02-15 ENCOUNTER — Ambulatory Visit: Payer: Self-pay | Admitting: Internal Medicine

## 2015-02-15 ENCOUNTER — Encounter: Payer: Self-pay | Admitting: *Deleted

## 2015-02-15 DIAGNOSIS — Z5181 Encounter for therapeutic drug level monitoring: Secondary | ICD-10-CM

## 2015-02-15 DIAGNOSIS — I482 Chronic atrial fibrillation, unspecified: Secondary | ICD-10-CM

## 2015-02-16 NOTE — Progress Notes (Signed)
  Oncology Nurse Navigator Documentation   Navigator Encounter Type: Other (02/15/15 1530) Patient Visit Type: Inpatient ([place]) (02/15/15 1530)     Visited Dustin Bean at [facility].  His dtr Pam and granddaughter from [place] were present.  He was alert and conversant, knew who I was.    Pam indicated he had been brought to [facility] following a fall at home and short hospitalization.  He did not break any bones as a result of the fall.  I answered their questions re eating/drinking during this transition period.  Pam again expressed appreciation for the care he received at [facility], appreciation for my visit.    Gayleen Orem, RN, BSN, [place] at [facility] [phone]                   Time Spent with Patient: 45 (02/15/15 1530)

## 2015-03-12 DEATH — deceased

## 2015-04-14 ENCOUNTER — Encounter: Payer: Self-pay | Admitting: Cardiovascular Disease

## 2015-04-14 NOTE — Progress Notes (Signed)
This encounter was created in error - please disregard.

## 2016-03-13 IMAGING — CR DG CHEST 1V PORT
1 series · 1 of 1 positions shown · non-contrast
Comparison: 02/01/2014

CLINICAL DATA: Hypoxia, hypertension, weakness

EXAM:
PORTABLE CHEST - 1 VIEW

[AP]
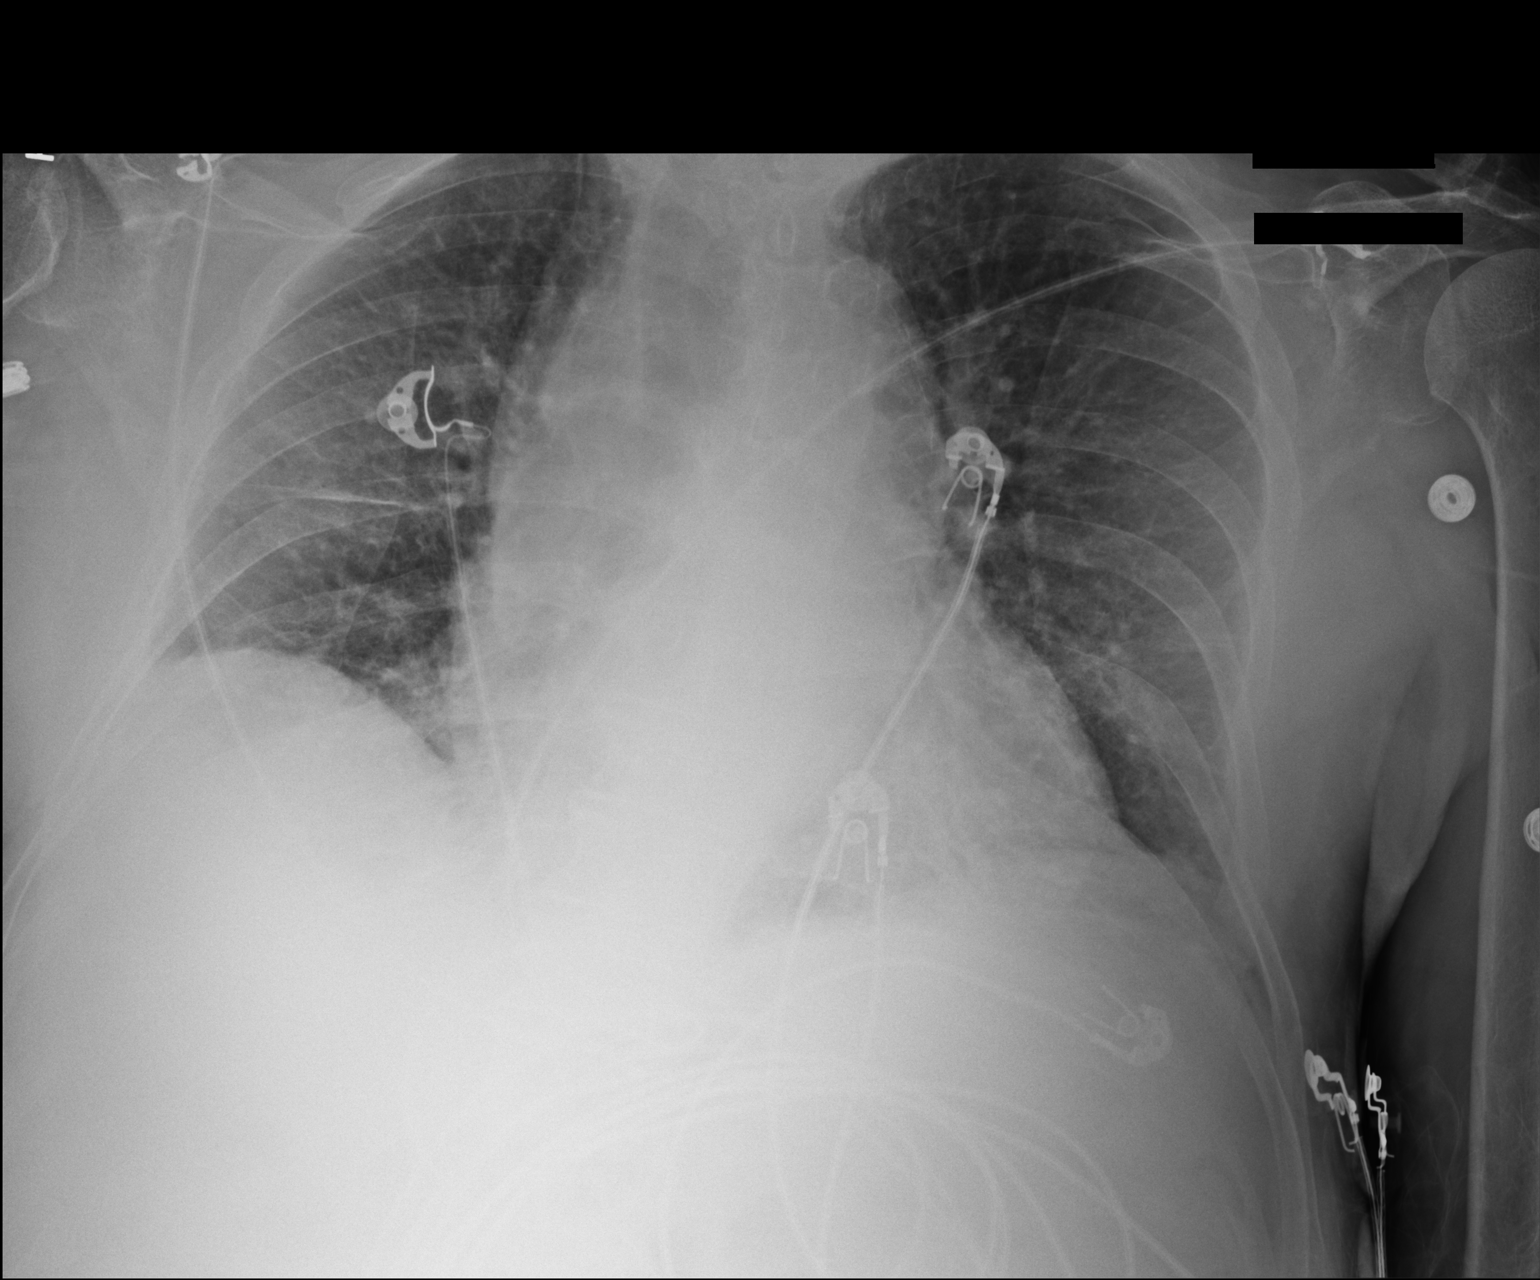

[1 of 1 positions shown; findings below may reference images not displayed]

FINDINGS: There is mild unchanged right hemidiaphragm elevation. Heart size is
unchanged. There is generalized interstitial prominence which is
new. This may represent a mild degree of congestive heart failure.
There is no alveolar edema. There is no large effusion.
IMPRESSION: Mild interstitial prominence, likely a minor degree of congestive
heart failure.

## 2016-04-16 IMAGING — MR MR HEAD W/O CM
8 of 10 series · 36 of 48 positions shown · non-contrast
Comparison: CT of the neck 10/11/2014.

CLINICAL DATA: Confusion.  Right-sided facial pain.  Parotid tumor.

EXAM:
MRI HEAD WITHOUT CONTRAST
TECHNIQUE: Multiplanar, multiecho pulse sequences of the brain and surrounding
structures were obtained without intravenous contrast.

[Series 3: DWI · axial · 3.0mm · 1.09mm/px · z∈[-77,+69]mm · 8 of 100 slices shown (1 of 4)]
[im 1/100]
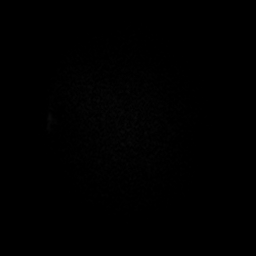
[im 12/100]
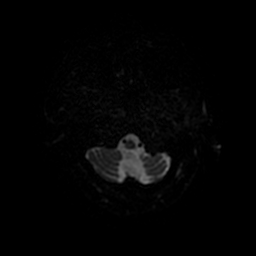
[im 34/100]
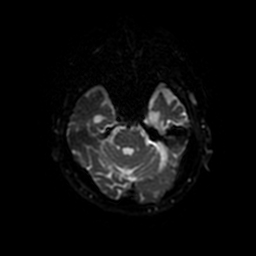
[im 45/100]
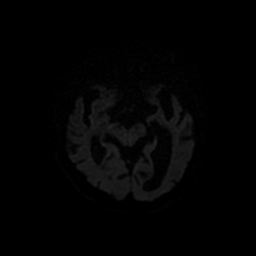
[im 56/100]
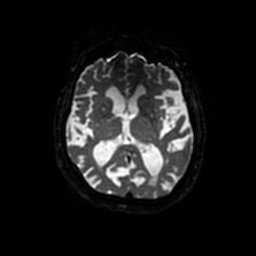
[im 67/100]
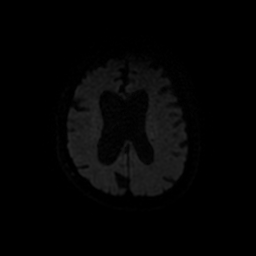
[im 89/100]
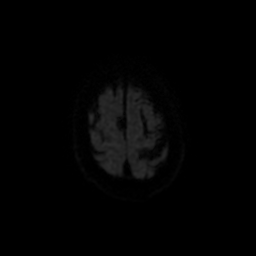
[im 100/100]
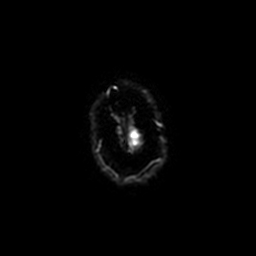

[Series 4: T1 · sagittal · 5.0mm · 0.47mm/px · 3 of 24 slices shown]
[im 1/24]
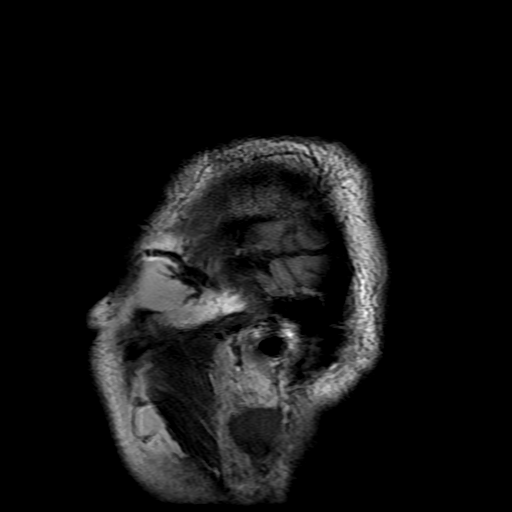
[im 12/24]
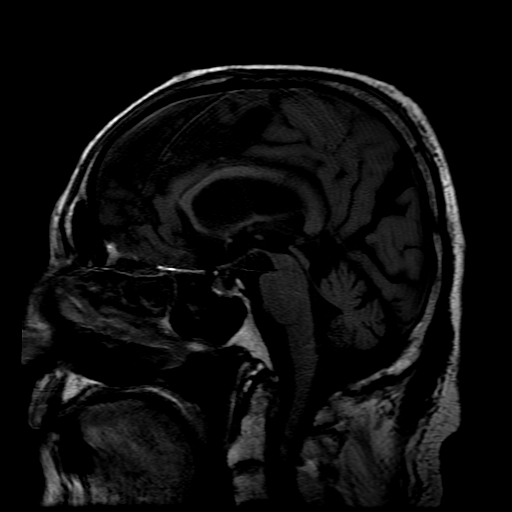
[im 24/24]
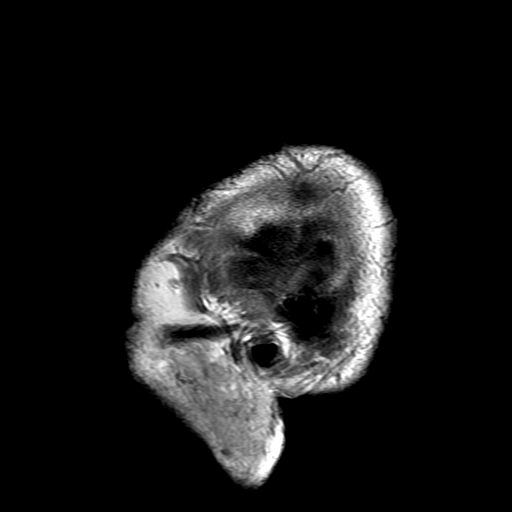

[Series 5: DWI · coronal · 5.0mm · 1.09mm/px · 7 of 62 slices shown (2 of 4)]
[im 1/62]
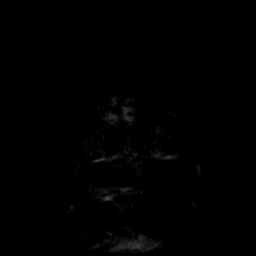
[im 11/62]
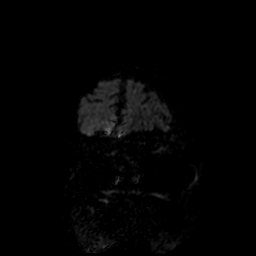
[im 21/62]
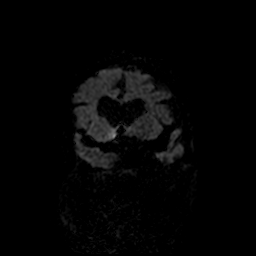
[im 31/62]
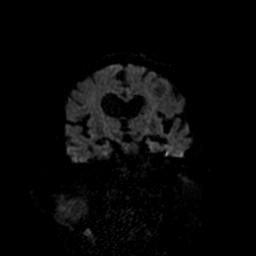
[im 41/62]
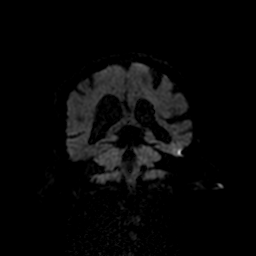
[im 51/62]
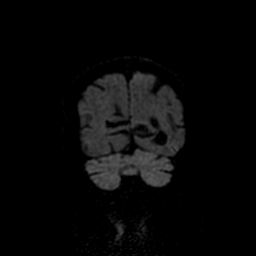
[im 62/62]
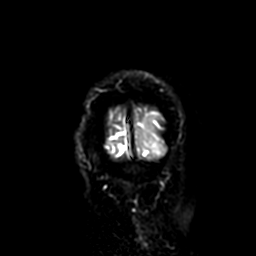

[Series 6: T2 · axial · 5.0mm · 0.43mm/px · z∈[-77,+72]mm · 3 of 26 slices shown (1 of 2)]
[im 1/26]
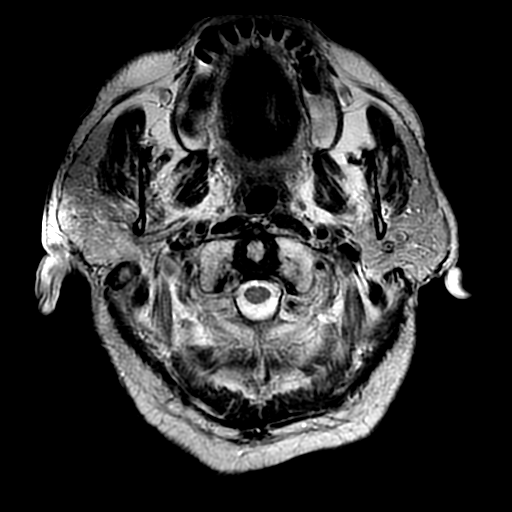
[im 13/26]
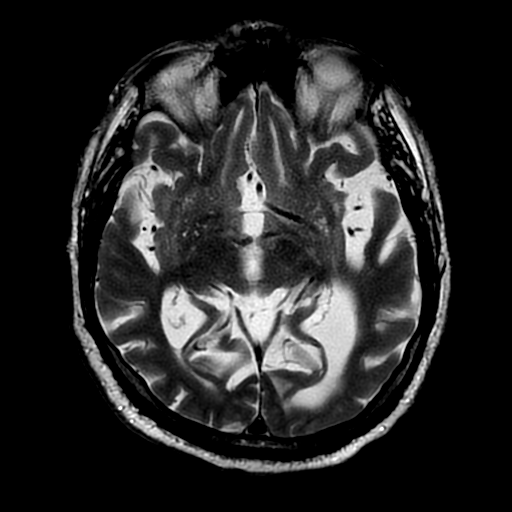
[im 26/26]
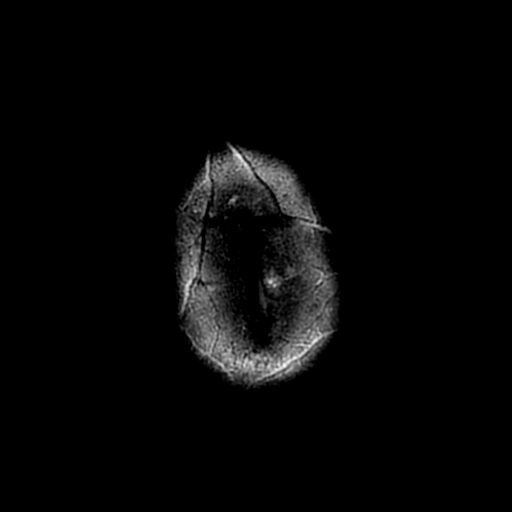

[Series 7: FLAIR · axial · 5.0mm · 0.43mm/px · z∈[-90,+85]mm · 3 of 26 slices shown]
[im 1/26]
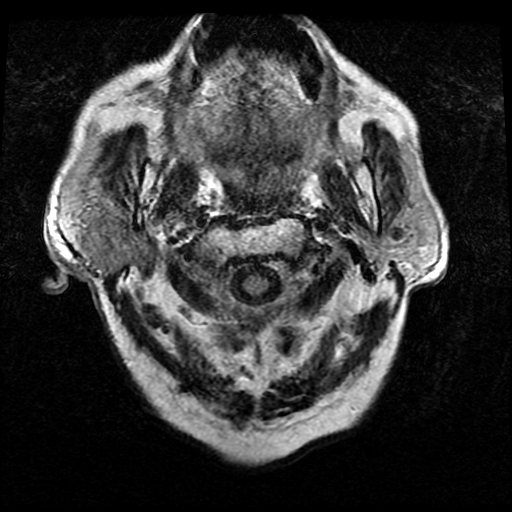
[im 13/26]
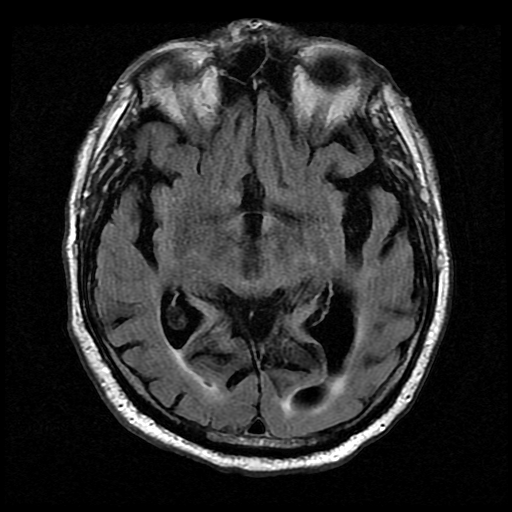
[im 26/26]
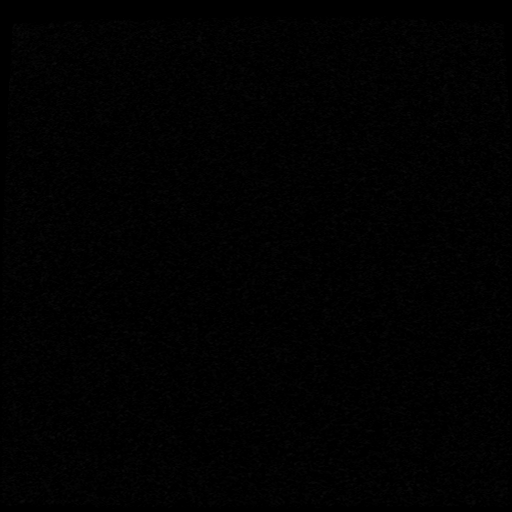

[Series 10: T2 · coronal · 5.0mm · 0.47mm/px · 3 of 24 slices shown (2 of 2)]
[im 1/24]
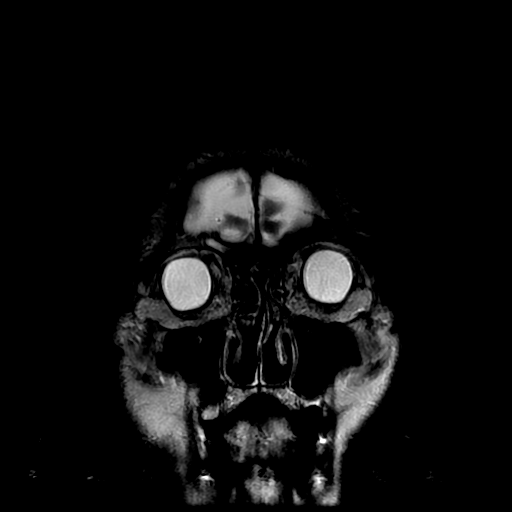
[im 12/24]
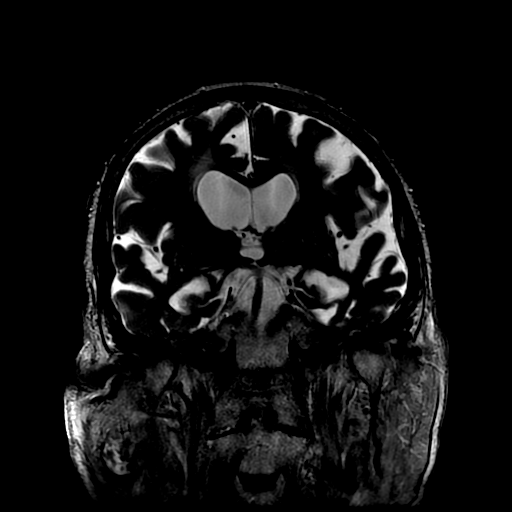
[im 24/24]
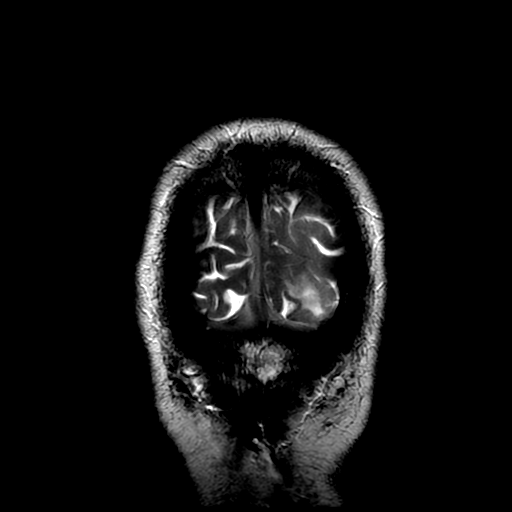

[Series 300: DWI · axial · 3.0mm · 1.09mm/px · z∈[-77,+69]mm · 6 of 50 slices shown (3 of 4)]
[im 1/50]
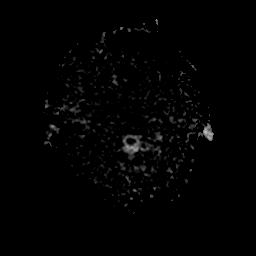
[im 10/50]
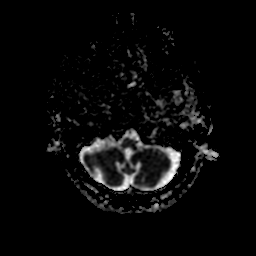
[im 20/50]
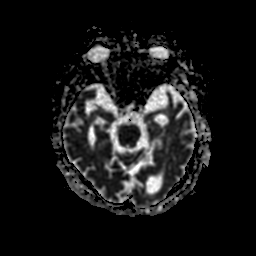
[im 30/50]
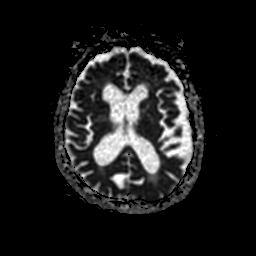
[im 40/50]
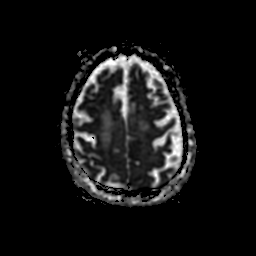
[im 50/50]
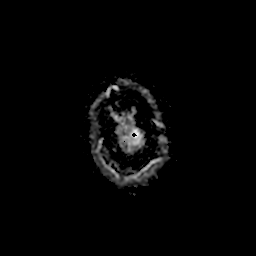

[Series 500: DWI · coronal · 5.0mm · 1.09mm/px · 3 of 31 slices shown (4 of 4)]
[im 1/31]
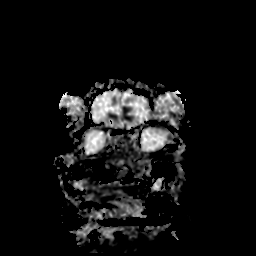
[im 16/31]
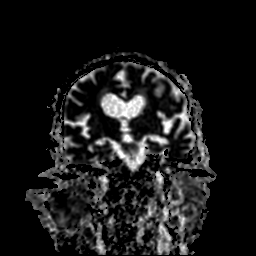
[im 31/31]
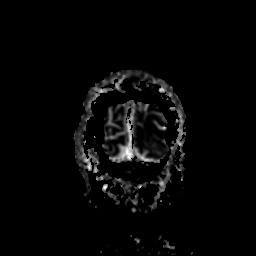

[36 of 48 positions shown; findings below may reference images not displayed]

FINDINGS: Moderate generalized atrophy is present. There is a remote infarct
with volume loss in the left occipital lobe. No acute cortical
infarct, hemorrhage, or mass lesion is present. There is diffuse
thinning of the corpus callosum. The ventricles are proportionate to
the degree of atrophy. No significant extraaxial fluid collection is
present.

Flow is present in the major intracranial arteries. Bilateral lens
replacements are noted. The paranasal sinuses and mastoid air cells
are clear.

The somewhat heterogeneous right parotid mass lesion is again noted
measuring 2.7 x 3.2 x 2.2 cm. This extends into the deep lobe of the
parotid. The skullbase is otherwise unremarkable. Midline structures
are otherwise within normal limits.
IMPRESSION: 1. No acute or focal lesion to explain the patient's confusion.
2. Right parotid mass lesion with extension into the deep lobe. This
may account for the patient's facial pain.
3. Moderate atrophy and white matter disease likely reflects the
sequela of chronic microvascular ischemia.
